# Patient Record
Sex: Male | Born: 1950 | Race: White | Hispanic: No | Marital: Married | Smoking: Former smoker
Health system: Southern US, Community
[De-identification: ages and names within clinical notes are randomized; demographics above are authoritative.]

## PROBLEM LIST (undated history)

## (undated) DIAGNOSIS — C649 Malignant neoplasm of unspecified kidney, except renal pelvis: Secondary | ICD-10-CM

## (undated) DIAGNOSIS — I10 Essential (primary) hypertension: Secondary | ICD-10-CM

## (undated) DIAGNOSIS — I4891 Unspecified atrial fibrillation: Secondary | ICD-10-CM

## (undated) DIAGNOSIS — J439 Emphysema, unspecified: Secondary | ICD-10-CM

## (undated) HISTORY — PX: APPENDECTOMY: SHX54

## (undated) HISTORY — PX: ROTATOR CUFF REPAIR: SHX139

## (undated) HISTORY — PX: TONSILLECTOMY: SUR1361

---

## 2004-09-23 ENCOUNTER — Emergency Department: Payer: Self-pay | Admitting: General Practice

## 2005-03-01 ENCOUNTER — Emergency Department: Payer: Self-pay | Admitting: Emergency Medicine

## 2008-06-04 ENCOUNTER — Ambulatory Visit: Payer: Self-pay | Admitting: Unknown Physician Specialty

## 2008-06-15 ENCOUNTER — Ambulatory Visit: Payer: Self-pay | Admitting: Unknown Physician Specialty

## 2008-10-19 ENCOUNTER — Emergency Department: Payer: Self-pay | Admitting: Emergency Medicine

## 2008-10-29 ENCOUNTER — Emergency Department: Payer: Self-pay | Admitting: Emergency Medicine

## 2011-09-11 ENCOUNTER — Emergency Department: Payer: Self-pay | Admitting: Emergency Medicine

## 2011-09-11 LAB — URINALYSIS, COMPLETE
Bacteria: NONE SEEN
Bilirubin,UR: NEGATIVE
Blood: NEGATIVE
Hyaline Cast: 1
Nitrite: NEGATIVE
Protein: NEGATIVE
Specific Gravity: 1.018 (ref 1.003–1.030)
WBC UR: <1 /HPF (ref 0–5)

## 2013-10-23 ENCOUNTER — Emergency Department: Payer: Self-pay | Admitting: Emergency Medicine

## 2013-10-23 LAB — BASIC METABOLIC PANEL
Anion Gap: 11 (ref 7–16)
BUN: 36 mg/dL — AB (ref 7–18)
CHLORIDE: 107 mmol/L (ref 98–107)
CO2: 25 mmol/L (ref 21–32)
CREATININE: 1.79 mg/dL — AB (ref 0.60–1.30)
Calcium, Total: 8.6 mg/dL (ref 8.5–10.1)
EGFR (Non-African Amer.): 40 — ABNORMAL LOW
GFR CALC AF AMER: 47 — AB
Glucose: 114 mg/dL — ABNORMAL HIGH (ref 65–99)
Osmolality: 294 (ref 275–301)
POTASSIUM: 3.4 mmol/L — AB (ref 3.5–5.1)
Sodium: 143 mmol/L (ref 136–145)

## 2013-10-23 LAB — CBC
HCT: 43.4 % (ref 40.0–52.0)
HGB: 14.2 g/dL (ref 13.0–18.0)
MCH: 30.7 pg (ref 26.0–34.0)
MCHC: 32.7 g/dL (ref 32.0–36.0)
MCV: 94 fL (ref 80–100)
Platelet: 237 10*3/uL (ref 150–440)
RBC: 4.62 10*6/uL (ref 4.40–5.90)
RDW: 13.3 % (ref 11.5–14.5)
WBC: 8.9 10*3/uL (ref 3.8–10.6)

## 2013-10-23 LAB — TROPONIN I: Troponin-I: <0.02 ng/mL

## 2015-03-08 ENCOUNTER — Emergency Department
Admission: EM | Admit: 2015-03-08 | Discharge: 2015-03-08 | Disposition: A | Discharge status: Home / Self Care | Payer: Non-veteran care | Attending: Student | Admitting: Student

## 2015-03-08 ENCOUNTER — Emergency Department: Payer: Non-veteran care

## 2015-03-08 ENCOUNTER — Encounter: Payer: Self-pay | Admitting: Emergency Medicine

## 2015-03-08 DIAGNOSIS — R1084 Generalized abdominal pain: Secondary | ICD-10-CM | POA: Insufficient documentation

## 2015-03-08 DIAGNOSIS — Y9389 Activity, other specified: Secondary | ICD-10-CM | POA: Insufficient documentation

## 2015-03-08 DIAGNOSIS — R55 Syncope and collapse: Secondary | ICD-10-CM | POA: Diagnosis present

## 2015-03-08 DIAGNOSIS — W01198A Fall on same level from slipping, tripping and stumbling with subsequent striking against other object, initial encounter: Secondary | ICD-10-CM | POA: Diagnosis not present

## 2015-03-08 DIAGNOSIS — Y998 Other external cause status: Secondary | ICD-10-CM | POA: Diagnosis not present

## 2015-03-08 DIAGNOSIS — R197 Diarrhea, unspecified: Secondary | ICD-10-CM | POA: Diagnosis not present

## 2015-03-08 DIAGNOSIS — Z87891 Personal history of nicotine dependence: Secondary | ICD-10-CM | POA: Diagnosis not present

## 2015-03-08 DIAGNOSIS — R110 Nausea: Secondary | ICD-10-CM | POA: Insufficient documentation

## 2015-03-08 DIAGNOSIS — Y9289 Other specified places as the place of occurrence of the external cause: Secondary | ICD-10-CM | POA: Diagnosis not present

## 2015-03-08 DIAGNOSIS — Z043 Encounter for examination and observation following other accident: Secondary | ICD-10-CM | POA: Insufficient documentation

## 2015-03-08 HISTORY — DX: Essential (primary) hypertension: I10

## 2015-03-08 HISTORY — DX: Malignant neoplasm of unspecified kidney, except renal pelvis: C64.9

## 2015-03-08 HISTORY — DX: Emphysema, unspecified: J43.9

## 2015-03-08 HISTORY — DX: Unspecified atrial fibrillation: I48.91

## 2015-03-08 LAB — BASIC METABOLIC PANEL
ANION GAP: 5 (ref 5–15)
BUN: 26 mg/dL — AB (ref 6–20)
CHLORIDE: 106 mmol/L (ref 101–111)
CO2: 27 mmol/L (ref 22–32)
Calcium: 9.2 mg/dL (ref 8.9–10.3)
Creatinine, Ser: 1.59 mg/dL — ABNORMAL HIGH (ref 0.61–1.24)
GFR calc Af Amer: 52 mL/min — ABNORMAL LOW (ref >60)
GFR calc non Af Amer: 45 mL/min — ABNORMAL LOW (ref >60)
GLUCOSE: 140 mg/dL — AB (ref 65–99)
POTASSIUM: 4.6 mmol/L (ref 3.5–5.1)
Sodium: 138 mmol/L (ref 135–145)

## 2015-03-08 LAB — URINALYSIS COMPLETE WITH MICROSCOPIC (ARMC ONLY)
BACTERIA UA: NONE SEEN
Bilirubin Urine: NEGATIVE
Glucose, UA: NEGATIVE mg/dL
Hgb urine dipstick: NEGATIVE
LEUKOCYTES UA: NEGATIVE
Nitrite: NEGATIVE
PH: 5 (ref 5.0–8.0)
PROTEIN: NEGATIVE mg/dL
SPECIFIC GRAVITY, URINE: 1.017 (ref 1.005–1.030)
SQUAMOUS EPITHELIAL / LPF: NONE SEEN

## 2015-03-08 LAB — CBC
HEMATOCRIT: 45.1 % (ref 40.0–52.0)
HEMOGLOBIN: 14.9 g/dL (ref 13.0–18.0)
MCH: 30.8 pg (ref 26.0–34.0)
MCHC: 33.1 g/dL (ref 32.0–36.0)
MCV: 93.1 fL (ref 80.0–100.0)
Platelets: 233 10*3/uL (ref 150–440)
RBC: 4.84 MIL/uL (ref 4.40–5.90)
RDW: 13.3 % (ref 11.5–14.5)
WBC: 11.1 10*3/uL — ABNORMAL HIGH (ref 3.8–10.6)

## 2015-03-08 LAB — HEPATIC FUNCTION PANEL
ALBUMIN: 4.2 g/dL (ref 3.5–5.0)
ALK PHOS: 55 U/L (ref 38–126)
ALT: 28 U/L (ref 17–63)
AST: 29 U/L (ref 15–41)
BILIRUBIN DIRECT: 0.2 mg/dL (ref 0.1–0.5)
BILIRUBIN INDIRECT: 0.6 mg/dL (ref 0.3–0.9)
BILIRUBIN TOTAL: 0.8 mg/dL (ref 0.3–1.2)
Total Protein: 7 g/dL (ref 6.5–8.1)

## 2015-03-08 LAB — TROPONIN I: Troponin I: <0.03 ng/mL (ref <0.031)

## 2015-03-08 LAB — LIPASE, BLOOD: LIPASE: 26 U/L (ref 11–51)

## 2015-03-08 MED ORDER — ONDANSETRON 4 MG PO TBDP: 4.0000 mg | ORAL_TABLET | Freq: Three times a day (TID) | ORAL | Status: AC | PRN

## 2015-03-08 MED ORDER — SODIUM CHLORIDE 0.9 % IV BOLUS (SEPSIS)
1000.0000 mL | Freq: Once | INTRAVENOUS | Status: AC
  Administered 2015-03-08: 1000 mL via INTRAVENOUS

## 2015-03-08 NOTE — ED Notes (Signed)
Patient explains that he had just gotten off the toilet 20 seconds before his syncopal episode.  Patient states he had severe abdominal pain with sudden onset of diarrhea, N/V.  Shortly after patient stood from the toilet he passed out in his hallway.

## 2015-03-08 NOTE — ED Provider Notes (Signed)
[facility] Emergency Department Provider Note  ____________________________________________  Time seen: Approximately 12:10 PM  I have reviewed the triage vital signs and the nursing notes.   HISTORY  Chief Complaint Loss of Consciousness and Abdominal Pain    HPI Duane Klein is a 64 y.o. male with past medical history of COPD, GERD, hyperlipidemia, coronary artery disease, stage III chronic kidney disease who presents for evaluation of syncopal episode which occurred suddenly just prior to arrival and is now resolved, was severe at its maximum. The patient reports that he went to bed last night in his usual state of health however awoke this morning and has had several episodes of large volume nonbloody diarrhea. He reports that he was sitting on the toilet today after having a bowel movement which was diarrhea, he complained to his wife that he felt "like I'm going to pass out". She encouraged him not to do it on the toilet. He stood up and began walking towards the bed, began increasingly lightheaded and fainted, falling and hitting his head. Afterwards, he developed severe abdominal "churning" and nausea and his wife noted that he was diaphoretic. He denies any chest pain or difficulty breathing. No fevers or chills. He reports that he began to feel lightheaded when he sits or stands up but there are no other modifying factors.  Contrary to the triagenote, he has no diagnosis of atrial fibrillation or kidney cancer.    Past medical history as above.   There are no active problems to display for this patient.   Past Surgical History  Procedure Laterality Date  . Appendectomy    . Tonsillectomy    . Rotator cuff repair      right    No current outpatient prescriptions on file.  Allergies Gabapentin and Tramadol  No family history on file.  Social History Social History  Substance Use Topics  . Smoking status: Former Research scientist (life sciences)  . Smokeless  tobacco: None  . Alcohol Use: No    Review of Systems Constitutional: No fever/chills Eyes: No visual changes. ENT: No sore throat. Cardiovascular: Denies chest pain. Respiratory: Denies shortness of breath. Gastrointestinal: + abdominal pain.  + nausea, no vomiting.  + diarrhea.  No constipation. Genitourinary: Negative for dysuria. Musculoskeletal: Negative for back pain. Skin: Negative for rash. Neurological: Negative for headaches, focal weakness or numbness.  10-point ROS otherwise negative.  ____________________________________________   PHYSICAL EXAM:  VITAL SIGNS: ED Triage Vitals  Enc Vitals Group     BP 03/08/15 1201 126/76 mmHg     Pulse Rate 03/08/15 1201 85     Resp 03/08/15 1201 15     Temp 03/08/15 1201 97.9 F (36.6 C)     Temp Source 03/08/15 1201 Oral     SpO2 03/08/15 1201 94 %     Weight 03/08/15 1201 198 lb (89.812 kg)     Height 03/08/15 1201 6\' 2"  (1.88 m)     Head Cir --      Peak Flow --      Pain Score 03/08/15 1203 8     Pain Loc --      Pain Edu? --      Excl. in [place]? --     Constitutional: Alert and oriented. Nontoxic appearing and in no acute distress. Eyes: Conjunctivae are normal. PERRL. EOMI. Head: Atraumatic. Nose: No congestion/rhinnorhea. Mouth/Throat: Mucous membranes are slightly dry.  Oropharynx non-erythematous. Neck: No stridor.  Cardiovascular: Normal rate, regular rhythm. Grossly normal heart sounds.  Good peripheral circulation. Respiratory: Normal respiratory effort.  No retractions. Lungs CTAB. Gastrointestinal: Soft and nontender. Bowel sounds No distention.  No CVA tenderness. Genitourinary: deferred Musculoskeletal: No lower extremity tenderness nor edema.  No joint effusions. Neurologic:  Normal speech and language. No gross focal neurologic deficits are appreciated. 5 out of 5 strength in bilateral upper and lower extremities, sensation intact to light touch throughout. Skin:  Skin is warm, dry and intact. No  rash noted. Psychiatric: Mood and affect are normal. Speech and behavior are normal.  ____________________________________________   LABS (all labs ordered are listed, but only abnormal results are displayed)  Labs Reviewed  BASIC METABOLIC PANEL - Abnormal; Notable for the following:    Glucose, Bld 140 (*)    BUN 26 (*)    Creatinine, Ser 1.59 (*)    GFR calc non Af Amer 45 (*)    GFR calc Af Amer 52 (*)    All other components within normal limits  CBC - Abnormal; Notable for the following:    WBC 11.1 (*)    All other components within normal limits  LIPASE, BLOOD  TROPONIN I  URINALYSIS COMPLETEWITH MICROSCOPIC ([facility] ONLY)  HEPATIC FUNCTION PANEL   ____________________________________________  EKG  ED ECG REPORT I, Joanne Gavel, the attending physician, personally viewed and interpreted this ECG.   Date: 03/08/2015  EKG Time: 11:59  Rate: 83  Rhythm: normal EKG, normal sinus rhythm  Axis: normal  Intervals:none  ST&T Change: No acute ST elevation.  ____________________________________________  RADIOLOGY  CXR IMPRESSION: Stable scarring right upper lobe. No edema or consolidation.  CT head  IMPRESSION: Chronic ischemic changes without acute abnormality.  ____________________________________________   PROCEDURES  Procedure(s) performed: None  Critical Care performed: No  ____________________________________________   INITIAL IMPRESSION / ASSESSMENT AND PLAN / ED COURSE  Pertinent labs & imaging results that were available during my care of the patient were reviewed by me and considered in my medical decision making (see chart for details).  Duane Klein is a 64 y.o. male with past medical history of COPD, GERD, hyperlipidemia, coronary artery disease, stage III chronic kidney disease who presents for evaluation of syncopal episode which occurred suddenly just prior to arrival. On exam, he is nontoxic appearing and in no acute distress.  Vital signs stable, he is afebrile. He has a benign abdominal examination. He does have positive orthostatic vital signs with significant decrease in his blood pressure when he attempts to sit up, he is unable to stand because she feels too lightheaded. Suspect vasovagal syncope in the setting of orthostatic hypotension. Suspect is likely dehydrated in the setting of a viral illness which has caused severe diarrhea today. He has had complete resolution of his abdominal pain has no abdominal tenderness. Doubt purely cardiogenic syncope given no palpitations, no chest pain, reassuring EKG. Doubt purely neurogenic cause of his syncope given intact neuro exam however we'll obtain screening labs, chest x-ray, CT head, will give liberal IV fluids and reassess for disposition.  ----------------------------------------- 5:09 PM on 03/08/2015 -----------------------------------------  The patient reports he feels much better after fluids. He is up, ambulatory without any lightheadedness. He is tolerating by mouth intake and desires discharge. Labs reviewed and are notable for very mild leukocytosis, BMP with creatinine mildly elevated at 1.59 likely secondary to dehydration. Troponin negative. Urinalysis is not consistent with infection. CT head and chest x-ray negative for any acute pathology. Discussed return precautions, need for close PCP follow-up as well as need for adequate  oral hydration. He and his wife at bedside are comfortable with the discharge plan. ____________________________________________   FINAL CLINICAL IMPRESSION(S) / ED DIAGNOSES  Final diagnoses:  Syncope, unspecified syncope type  Diarrhea, unspecified type  Generalized abdominal pain      Joanne Gavel, MD 03/08/15 1712

## 2015-03-08 NOTE — ED Notes (Signed)
Pt comes into the ED via EMS from home c/o lower abdominal pain that caused N/V.  Patient then had sudden onset of syncopal episode where he hit had LOC and hit his head.  Patient currently denies any N/V or lower abd pain but does state he has a headache.  Patient has h/o HTN, emphysema, A-fib, and recent diagnoses of stage 3 kidney cancer. EKG unremarkable, 110/70, 80-90's HR, 134 CBG.

## 2015-03-08 NOTE — Discharge Instructions (Signed)

## 2015-10-23 ENCOUNTER — Other Ambulatory Visit: Payer: Self-pay | Admitting: Physician Assistant

## 2015-10-23 DIAGNOSIS — R51 Headache: Principal | ICD-10-CM

## 2015-10-23 DIAGNOSIS — R519 Headache, unspecified: Secondary | ICD-10-CM

## 2015-11-05 ENCOUNTER — Ambulatory Visit: Payer: No Typology Code available for payment source

## 2016-11-17 ENCOUNTER — Encounter: Payer: Non-veteran care | Attending: Student in an Organized Health Care Education/Training Program

## 2016-11-17 VITALS — Ht 73.0 in | Wt 198.9 lb

## 2016-11-17 DIAGNOSIS — Z85528 Personal history of other malignant neoplasm of kidney: Secondary | ICD-10-CM | POA: Diagnosis not present

## 2016-11-17 DIAGNOSIS — I4891 Unspecified atrial fibrillation: Secondary | ICD-10-CM | POA: Diagnosis not present

## 2016-11-17 DIAGNOSIS — Z87891 Personal history of nicotine dependence: Secondary | ICD-10-CM | POA: Diagnosis not present

## 2016-11-17 DIAGNOSIS — J449 Chronic obstructive pulmonary disease, unspecified: Secondary | ICD-10-CM

## 2016-11-17 DIAGNOSIS — I10 Essential (primary) hypertension: Secondary | ICD-10-CM | POA: Insufficient documentation

## 2016-11-17 NOTE — Progress Notes (Signed)
Pulmonary Individual Treatment Plan  Patient Details  Name: Duane Klein MRN: [id number] Date of Birth: Sep 19, 1950 Referring Provider:     Pulmonary Rehab from 11/17/2016 in [facility] Cardiac and Pulmonary Rehab  Referring Provider  [name]      Initial Encounter Date:    Pulmonary Rehab from 11/17/2016 in [facility] Cardiac and Pulmonary Rehab  Date  11/17/16  Referring Provider  [name]      Visit Diagnosis: Chronic obstructive pulmonary disease, unspecified COPD type ([place])  Patient's Home Medications on Admission:  Current Outpatient Prescriptions:  .  ondansetron (ZOFRAN ODT) 4 MG disintegrating tablet, Take 1 tablet (4 mg total) by mouth every 8 (eight) hours as needed for nausea or vomiting., Disp: 10 tablet, Rfl: 0  Past Medical History: Past Medical History:  Diagnosis Date  . Atrial fibrillation ([place])   . Cancer of kidney ([place])   . Emphysema lung ([place])   . Hypertension     Tobacco Use: History  Smoking Status  . Former Smoker  . Packs/day: 3.00  . Years: 35.00  . Types: Cigarettes  . Quit date: 04/09/2002  Smokeless Tobacco  . Former Systems developer  . Types: Snuff, Chew  . Quit date: 03/09/1997    Comment: patient has not smoked since 2004    Labs: Recent Review Flowsheet Data    There is no flowsheet data to display.       Pulmonary Assessment Scores:     Pulmonary Assessment Scores    Row Name 11/17/16 1128         ADL UCSD   ADL Phase Entry     SOB Score total 46     Rest 0     Walk 2     Stairs 3     Bath 1     Dress 4     Shop 1       CAT Score   CAT Score 26       mMRC Score   mMRC Score 0        Pulmonary Function Assessment:     Pulmonary Function Assessment - 11/17/16 1125      Initial Spirometry Results   FVC% 56 %   FEV1% 52 %   FEV1/FVC Ratio 69.55   Comments Good effort     Post Bronchodilator Spirometry Results   FVC% 48.16 %   FEV1% 34.32 %   FEV1/FVC Ratio 53.51   Comments Good effort. best of 2     Breath   Bilateral  Breath Sounds Clear   Shortness of Breath Limiting activity      Exercise Target Goals: Date: 11/17/16  Exercise Program Goal: Individual exercise prescription set with THRR, safety & activity barriers. Participant demonstrates ability to understand and report RPE using BORG scale, to self-measure pulse accurately, and to acknowledge the importance of the exercise prescription.  Exercise Prescription Goal: Starting with aerobic activity 30 plus minutes a day, 3 days per week for initial exercise prescription. Provide home exercise prescription and guidelines that participant acknowledges understanding prior to discharge.  Activity Barriers & Risk Stratification:   6 Minute Walk:     6 Minute Walk    Row Name 11/17/16 1230         6 Minute Walk   Distance 1500 feet     Walk Time 6 minutes     # of Rest Breaks 0     MPH 2.84     METS 3.82     RPE 13  Perceived Dyspnea  3     Symptoms No     Resting HR 83 bpm     Resting BP 118/62     Resting Oxygen Saturation  94 %     Exercise Oxygen Saturation  during 6 min walk 94 %     Max Ex. HR 105 bpm     Max Ex. BP 134/56       Interval HR   3 Minute HR 97     5 Minute HR 99     6 Minute HR 105     2 Minute Post HR 80     Interval Heart Rate? Yes       Interval Oxygen   Interval Oxygen? Yes     Baseline Oxygen Saturation % 94 %     1 Minute Liters of Oxygen 0 L     2 Minute Liters of Oxygen 0 L     3 Minute Oxygen Saturation % 94 %     3 Minute Liters of Oxygen 0 L     4 Minute Liters of Oxygen 0 L     5 Minute Oxygen Saturation % 95 %     5 Minute Liters of Oxygen 0 L     6 Minute Oxygen Saturation % 94 %     6 Minute Liters of Oxygen 0 L     2 Minute Post Oxygen Saturation % 96 %     2 Minute Post Liters of Oxygen 0 L       Oxygen Initial Assessment:     Oxygen Initial Assessment - 11/17/16 1118      Home Oxygen   Home Oxygen Device None   Sleep Oxygen Prescription None   Home Exercise Oxygen  Prescription None   Home at Rest Exercise Oxygen Prescription None     Initial 6 min Walk   Oxygen Used None     Program Oxygen Prescription   Program Oxygen Prescription None     Intervention   Short Term Goals To learn and understand importance of maintaining oxygen saturations>88%;To learn and demonstrate proper use of respiratory medications;To learn and demonstrate proper pursed lip breathing techniques or other breathing techniques.;To learn and understand importance of monitoring SPO2 with pulse oximeter and demonstrate accurate use of the pulse oximeter.   Long  Term Goals Maintenance of O2 saturations>88%;Compliance with respiratory medication;Demonstrates proper use of MDI's;Exhibits proper breathing techniques, such as pursed lip breathing or other method taught during program session;Verbalizes importance of monitoring SPO2 with pulse oximeter and return demonstration      Oxygen Re-Evaluation:   Oxygen Discharge (Final Oxygen Re-Evaluation):   Initial Exercise Prescription:     Initial Exercise Prescription - 11/17/16 1200      Date of Initial Exercise RX and Referring Provider   Date 11/17/16   Referring Provider [name]     Treadmill   MPH 2.8   Grade 1.5   Minutes 15   METs 3.72     Recumbant Bike   Level 5   RPM 60   Watts 50   Minutes 15   METs 3.75     REL-XR   Level 4   Speed 50   Minutes 15   METs 3.7     Prescription Details   Frequency (times per week) 3   Duration Progress to 45 minutes of aerobic exercise without signs/symptoms of physical distress     Intensity   THRR 40-80% of Max Heartrate 112-141  Ratings of Perceived Exertion 11-15   Perceived Dyspnea 0-4     Resistance Training   Training Prescription Yes   Weight 4   Reps 10-15      Perform Capillary Blood Glucose checks as needed.  Exercise Prescription Changes:   Exercise Comments:   Exercise Goals and Review:      Exercise Goals    Row Name 11/17/16 1319              Exercise Goals   Increase Physical Activity Yes       Intervention Provide advice, education, support and counseling about physical activity/exercise needs.;Develop an individualized exercise prescription for aerobic and resistive training based on initial evaluation findings, risk stratification, comorbidities and participant's personal goals.       Expected Outcomes Achievement of increased cardiorespiratory fitness and enhanced flexibility, muscular endurance and strength shown through measurements of functional capacity and personal statement of participant.       Increase Strength and Stamina Yes       Intervention Provide advice, education, support and counseling about physical activity/exercise needs.;Develop an individualized exercise prescription for aerobic and resistive training based on initial evaluation findings, risk stratification, comorbidities and participant's personal goals.       Expected Outcomes Achievement of increased cardiorespiratory fitness and enhanced flexibility, muscular endurance and strength shown through measurements of functional capacity and personal statement of participant.       Able to understand and use rate of perceived exertion (RPE) scale Yes       Intervention Provide education and explanation on how to use RPE scale       Expected Outcomes Long Term:  Able to use RPE to guide intensity level when exercising independently;Short Term: Able to use RPE daily in rehab to express subjective intensity level       Able to understand and use Dyspnea scale Yes       Intervention Provide education and explanation on how to use Dyspnea scale       Expected Outcomes Short Term: Able to use Dyspnea scale daily in rehab to express subjective sense of shortness of breath during exertion;Long Term: Able to use Dyspnea scale to guide intensity level when exercising independently       Knowledge and understanding of Target Heart Rate Range (THRR) Yes        Intervention Provide education and explanation of THRR including how the numbers were predicted and where they are located for reference       Expected Outcomes Short Term: Able to state/look up THRR;Long Term: Able to use THRR to govern intensity when exercising independently;Short Term: Able to use daily as guideline for intensity in rehab       Able to check pulse independently Yes       Intervention Provide education and demonstration on how to check pulse in carotid and radial arteries.;Review the importance of being able to check your own pulse for safety during independent exercise       Expected Outcomes Short Term: Able to explain why pulse checking is important during independent exercise;Long Term: Able to check pulse independently and accurately       Understanding of Exercise Prescription Yes       Intervention Provide education, explanation, and written materials on patient's individual exercise prescription       Expected Outcomes Short Term: Able to explain program exercise prescription;Long Term: Able to explain home exercise prescription to exercise independently  Exercise Goals Re-Evaluation :   Discharge Exercise Prescription (Final Exercise Prescription Changes):   Nutrition:  Target Goals: Understanding of nutrition guidelines, daily intake of sodium 1500mg , cholesterol 200mg , calories 30% from fat and 7% or less from saturated fats, daily to have 5 or more servings of fruits and vegetables.  Biometrics:     Pre Biometrics - 11/17/16 1226      Pre Biometrics   Height 6\' 1"  (1.854 m)   Weight 198 lb 14.4 oz (90.2 kg)   Waist Circumference 41 inches   Hip Circumference 42 inches   Waist to Hip Ratio 0.98 %   BMI (Calculated) 26.25       Nutrition Therapy Plan and Nutrition Goals:     Nutrition Therapy & Goals - 11/17/16 1117      Nutrition Therapy   RD appointment defered Yes     Personal Nutrition Goals   Comments His Doctor guides his  eating habits.     Intervention Plan   Intervention Nutrition handout(s) given to patient.;Prescribe, educate and counsel regarding individualized specific dietary modifications aiming towards targeted core components such as weight, hypertension, lipid management, diabetes, heart failure and other comorbidities.   Expected Outcomes Short Term Goal: Understand basic principles of dietary content, such as calories, fat, sodium, cholesterol and nutrients.;Short Term Goal: A plan has been developed with personal nutrition goals set during dietitian appointment.      Nutrition Discharge: Rate Your Plate Scores:   Nutrition Goals Re-Evaluation:   Nutrition Goals Discharge (Final Nutrition Goals Re-Evaluation):   Psychosocial: Target Goals: Acknowledge presence or absence of significant depression and/or stress, maximize coping skills, provide positive support system. Participant is able to verbalize types and ability to use techniques and skills needed for reducing stress and depression.   Initial Review & Psychosocial Screening:     Initial Psych Review & Screening - 11/17/16 1116      Initial Review   Current issues with History of Depression     Family Dynamics   Good Support System? Yes     Barriers   Psychosocial barriers to participate in program There are no identifiable barriers or psychosocial needs.     Screening Interventions   Interventions Encouraged to exercise;Yes   Expected Outcomes Short Term goal: Utilizing psychosocial counselor, staff and physician to assist with identification of specific Stressors or current issues interfering with healing process. Setting desired goal for each stressor or current issue identified.;Short Term goal: Identification and review with participant of any Quality of Life or Depression concerns found by scoring the questionnaire.;Long Term goal: The participant improves quality of Life and PHQ9 Scores as seen by post scores and/or  verbalization of changes;Long Term Goal: Stressors or current issues are controlled or eliminated.      Quality of Life Scores:   PHQ-9: Recent Review Flowsheet Data    Depression screen [facility] 2/9 11/17/2016   Decreased Interest 0   Down, Depressed, Hopeless 0   PHQ - 2 Score 0   Altered sleeping 0   Tired, decreased energy 1   Change in appetite 0   Feeling bad or failure about yourself  0   Trouble concentrating 0   Moving slowly or fidgety/restless 0   Suicidal thoughts 0   PHQ-9 Score 1   Difficult doing work/chores Somewhat difficult     Interpretation of Total Score  Total Score Depression Severity:  1-4 = Minimal depression, 5-9 = Mild depression, 10-14 = Moderate depression, 15-19 = Moderately severe depression,  20-27 = Severe depression   Psychosocial Evaluation and Intervention:   Psychosocial Re-Evaluation:   Psychosocial Discharge (Final Psychosocial Re-Evaluation):   Education: Education Goals: Education classes will be provided on a weekly basis, covering required topics. Participant will state understanding/return demonstration of topics presented.  Learning Barriers/Preferences:     Learning Barriers/Preferences - 11/17/16 1129      Learning Barriers/Preferences   Learning Barriers Sight   Learning Preferences None      Education Topics: Initial Evaluation Education: - Verbal, written and demonstration of respiratory meds, RPE/PD scales, oximetry and breathing techniques. Instruction on use of nebulizers and MDIs: cleaning and proper use, rinsing mouth with steroid doses and importance of monitoring MDI activations.   Pulmonary Rehab from 11/17/2016 in [facility] Cardiac and Pulmonary Rehab  Date  11/17/16  Educator  [facility]  Instruction Review Code  1- Verbalizes Understanding      General Nutrition Guidelines/Fats and Fiber: -Group instruction provided by verbal, written material, models and posters to present the general guidelines for heart healthy  nutrition. Gives an explanation and review of dietary fats and fiber.   Controlling Sodium/Reading Food Labels: -Group verbal and written material supporting the discussion of sodium use in heart healthy nutrition. Review and explanation with models, verbal and written materials for utilization of the food label.   Exercise Physiology & Risk Factors: - Group verbal and written instruction with models to review the exercise physiology of the cardiovascular system and associated critical values. Details cardiovascular disease risk factors and the goals associated with each risk factor.   Aerobic Exercise & Resistance Training: - Gives group verbal and written discussion on the health impact of inactivity. On the components of aerobic and resistive training programs and the benefits of this training and how to safely progress through these programs.   Flexibility, Balance, General Exercise Guidelines: - Provides group verbal and written instruction on the benefits of flexibility and balance training programs. Provides general exercise guidelines with specific guidelines to those with heart or lung disease. Demonstration and skill practice provided.   Stress Management: - Provides group verbal and written instruction about the health risks of elevated stress, cause of high stress, and healthy ways to reduce stress.   Depression: - Provides group verbal and written instruction on the correlation between heart/lung disease and depressed mood, treatment options, and the stigmas associated with seeking treatment.   Exercise & Equipment Safety: - Individual verbal instruction and demonstration of equipment use and safety with use of the equipment.   Infection Prevention: - Provides verbal and written material to individual with discussion of infection control including proper hand washing and proper equipment cleaning during exercise session.   Pulmonary Rehab from 11/17/2016 in [facility] Cardiac  and Pulmonary Rehab  Date  11/17/16  Educator  [facility]  Instruction Review Code  1- Verbalizes Understanding      Falls Prevention: - Provides verbal and written material to individual with discussion of falls prevention and safety.   Pulmonary Rehab from 11/17/2016 in [facility] Cardiac and Pulmonary Rehab  Date  11/17/16  Educator  [facility]  Instruction Review Code  1- Verbalizes Understanding      Diabetes: - Individual verbal and written instruction to review signs/symptoms of diabetes, desired ranges of glucose level fasting, after meals and with exercise. Advice that pre and post exercise glucose checks will be done for 3 sessions at entry of program.   Chronic Lung Diseases: - Group verbal and written instruction to review new updates, new respiratory medications,  new advancements in procedures and treatments. Provide informative websites and "800" numbers of self-education.   Lung Procedures: - Group verbal and written instruction to describe testing methods done to diagnose lung disease. Review the outcome of test results. Describe the treatment choices: Pulmonary Function Tests, ABGs and oximetry.   Energy Conservation: - Provide group verbal and written instruction for methods to conserve energy, plan and organize activities. Instruct on pacing techniques, use of adaptive equipment and posture/positioning to relieve shortness of breath.   Triggers: - Group verbal and written instruction to review types of environmental controls: home humidity, furnaces, filters, dust mite/pet prevention, HEPA vacuums. To discuss weather changes, air quality and the benefits of nasal washing.   Exacerbations: - Group verbal and written instruction to provide: warning signs, infection symptoms, calling MD promptly, preventive modes, and value of vaccinations. Review: effective airway clearance, coughing and/or vibration techniques. Create an Sports administrator.   Oxygen: - Individual and group verbal and  written instruction on oxygen therapy. Includes supplement oxygen, available portable oxygen systems, continuous and intermittent flow rates, oxygen safety, concentrators, and Medicare reimbursement for oxygen.   Pulmonary Rehab from 11/17/2016 in [facility] Cardiac and Pulmonary Rehab  Date  11/17/16  Educator  [facility]  Instruction Review Code  1- Verbalizes Understanding      Respiratory Medications: - Group verbal and written instruction to review medications for lung disease. Drug class, frequency, complications, importance of spacers, rinsing mouth after steroid MDI's, and proper cleaning methods for nebulizers.   Pulmonary Rehab from 11/17/2016 in [facility] Cardiac and Pulmonary Rehab  Date  11/17/16  Educator  [facility]  Instruction Review Code  1- Verbalizes Understanding      AED/CPR: - Group verbal and written instruction with the use of models to demonstrate the basic use of the AED with the basic ABC's of resuscitation.   Breathing Retraining: - Provides individuals verbal and written instruction on purpose, frequency, and proper technique of diaphragmatic breathing and pursed-lipped breathing. Applies individual practice skills.   Pulmonary Rehab from 11/17/2016 in [facility] Cardiac and Pulmonary Rehab  Date  11/17/16  Educator  [facility]  Instruction Review Code  1- Verbalizes Understanding      Anatomy and Physiology of the Lungs: - Group verbal and written instruction with the use of models to provide basic lung anatomy and physiology related to function, structure and complications of lung disease.   Anatomy & Physiology of the Heart: - Group verbal and written instruction and models provide basic cardiac anatomy and physiology, with the coronary electrical and arterial systems. Review of: AMI, Angina, Valve disease, Heart Failure, Cardiac Arrhythmia, Pacemakers, and the ICD.   Heart Failure: - Group verbal and written instruction on the basics of heart failure: signs/symptoms, treatments,  explanation of ejection fraction, enlarged heart and cardiomyopathy.   Sleep Apnea: - Individual verbal and written instruction to review Obstructive Sleep Apnea. Review of risk factors, methods for diagnosing and types of masks and machines for OSA.   Anxiety: - Provides group, verbal and written instruction on the correlation between heart/lung disease and anxiety, treatment options, and management of anxiety.   Relaxation: - Provides group, verbal and written instruction about the benefits of relaxation for patients with heart/lung disease. Also provides patients with examples of relaxation techniques.   Cardiac Medications: - Group verbal and written instruction to review commonly prescribed medications for heart disease. Reviews the medication, class of the drug, and side effects.   Know Your Numbers: -Group verbal and written instruction about important  numbers in your health.  Review of Cholesterol, Blood Pressure, Diabetes, and BMI and the role they play in your overall health.   Other: -Provides group and verbal instruction on various topics (see comments)    Knowledge Questionnaire Score:     Knowledge Questionnaire Score - 11/17/16 1129      Knowledge Questionnaire Score   Pre Score 8/10  Reviewed with patient       Core Components/Risk Factors/Patient Goals at Admission:     Personal Goals and Risk Factors at Admission - 11/17/16 1130      Core Components/Risk Factors/Patient Goals on Admission    Weight Management Yes   Intervention Weight Management: Develop a combined nutrition and exercise program designed to reach desired caloric intake, while maintaining appropriate intake of nutrient and fiber, sodium and fats, and appropriate energy expenditure required for the weight goal.;Weight Management: Provide education and appropriate resources to help participant work on and attain dietary goals.;Weight Management/Obesity: Establish reasonable short term and  long term weight goals.   Expected Outcomes Short Term: Continue to assess and modify interventions until short term weight is achieved;Weight Loss: Understanding of general recommendations for a balanced deficit meal plan, which promotes 1-2 lb weight loss per week and includes a negative energy balance of [phone] kcal/d;Understanding recommendations for meals to include 15-35% energy as protein, 25-35% energy from fat, 35-60% energy from carbohydrates, less than 200mg  of dietary cholesterol, 20-35 gm of total fiber daily;Understanding of distribution of calorie intake throughout the day with the consumption of 4-5 meals/snacks   Improve shortness of breath with ADL's Yes   Intervention Provide education, individualized exercise plan and daily activity instruction to help decrease symptoms of SOB with activities of daily living.   Expected Outcomes Short Term: Achieves a reduction of symptoms when performing activities of daily living.   Hypertension Yes   Intervention Provide education on lifestyle modifcations including regular physical activity/exercise, weight management, moderate sodium restriction and increased consumption of fresh fruit, vegetables, and low fat dairy, alcohol moderation, and smoking cessation.;Monitor prescription use compliance.   Expected Outcomes Short Term: Continued assessment and intervention until BP is < 140/61mm HG in hypertensive participants. < 130/59mm HG in hypertensive participants with diabetes, heart failure or chronic kidney disease.;Long Term: Maintenance of blood pressure at goal levels.      Core Components/Risk Factors/Patient Goals Review:    Core Components/Risk Factors/Patient Goals at Discharge (Final Review):    ITP Comments:     ITP Comments    Row Name 11/17/16 1213           ITP Comments Medical Evaluation Completed. Chart sent to Dr. Emily Filbert director of [place] for signature and review.          Comments: Initial ITP

## 2016-11-17 NOTE — Patient Instructions (Addendum)
Patient Instructions  Patient Details  Name: Duane Klein MRN: [id number] Date of Birth: 1950-03-12 Referring Provider:  [place] are the personal goals you chose as well as exercise and nutrition goals. Our goal is to help you keep on track towards obtaining and maintaining your goals. We will be discussing your progress on these goals with you throughout the program.  Initial Exercise Prescription:     Initial Exercise Prescription - 11/17/16 1200      Date of Initial Exercise RX and Referring Provider   Date 11/17/16   Referring Provider [name]     Treadmill   MPH 2.8   Grade 1.5   Minutes 15   METs 3.72     Recumbant Bike   Level 5   RPM 60   Watts 50   Minutes 15   METs 3.75     REL-XR   Level 4   Speed 50   Minutes 15   METs 3.7     Prescription Details   Frequency (times per week) 3   Duration Progress to 45 minutes of aerobic exercise without signs/symptoms of physical distress     Intensity   THRR 40-80% of Max Heartrate 112-141   Ratings of Perceived Exertion 11-15   Perceived Dyspnea 0-4     Resistance Training   Training Prescription Yes   Weight 4   Reps 10-15      Exercise Goals: Frequency: Be able to perform aerobic exercise three times per week working toward 3-5 days per week.  Intensity: Work with a perceived exertion of 11 (fairly light) - 15 (hard) as tolerated. Follow your new exercise prescription and watch for changes in prescription as you progress with the program. Changes will be reviewed with you when they are made.  Duration: You should be able to do 30 minutes of continuous aerobic exercise in addition to a 5 minute warm-up and a 5 minute cool-down routine.  Nutrition Goals: Your personal nutrition goals will be established when you do your nutrition analysis with the dietician.  The following are nutrition guidelines to follow: Cholesterol < 200mg /day Sodium < 1500mg /day Fiber: Men over 50 yrs - 30  grams per day  Personal Goals:     Personal Goals and Risk Factors at Admission - 11/17/16 1130      Core Components/Risk Factors/Patient Goals on Admission    Weight Management Yes   Intervention Weight Management: Develop a combined nutrition and exercise program designed to reach desired caloric intake, while maintaining appropriate intake of nutrient and fiber, sodium and fats, and appropriate energy expenditure required for the weight goal.;Weight Management: Provide education and appropriate resources to help participant work on and attain dietary goals.;Weight Management/Obesity: Establish reasonable short term and long term weight goals.   Expected Outcomes Short Term: Continue to assess and modify interventions until short term weight is achieved;Weight Loss: Understanding of general recommendations for a balanced deficit meal plan, which promotes 1-2 lb weight loss per week and includes a negative energy balance of [phone] kcal/d;Understanding recommendations for meals to include 15-35% energy as protein, 25-35% energy from fat, 35-60% energy from carbohydrates, less than 200mg  of dietary cholesterol, 20-35 gm of total fiber daily;Understanding of distribution of calorie intake throughout the day with the consumption of 4-5 meals/snacks   Improve shortness of breath with ADL's Yes   Intervention Provide education, individualized exercise plan and daily activity instruction to help decrease symptoms of SOB with activities of daily living.  Expected Outcomes Short Term: Achieves a reduction of symptoms when performing activities of daily living.   Hypertension Yes   Intervention Provide education on lifestyle modifcations including regular physical activity/exercise, weight management, moderate sodium restriction and increased consumption of fresh fruit, vegetables, and low fat dairy, alcohol moderation, and smoking cessation.;Monitor prescription use compliance.   Expected Outcomes Short  Term: Continued assessment and intervention until BP is < 140/57mm HG in hypertensive participants. < 130/39mm HG in hypertensive participants with diabetes, heart failure or chronic kidney disease.;Long Term: Maintenance of blood pressure at goal levels.      Tobacco Use Initial Evaluation: History  Smoking Status  . Former Smoker  . Packs/day: 3.00  . Years: 35.00  . Types: Cigarettes  . Quit date: 04/09/2002  Smokeless Tobacco  . Former Systems developer  . Types: Snuff, Chew  . Quit date: 03/09/1997    Comment: patient has not smoked since 2004    Exercise Goals and Review:     Exercise Goals    Row Name 11/17/16 1319             Exercise Goals   Increase Physical Activity Yes       Intervention Provide advice, education, support and counseling about physical activity/exercise needs.;Develop an individualized exercise prescription for aerobic and resistive training based on initial evaluation findings, risk stratification, comorbidities and participant's personal goals.       Expected Outcomes Achievement of increased cardiorespiratory fitness and enhanced flexibility, muscular endurance and strength shown through measurements of functional capacity and personal statement of participant.       Increase Strength and Stamina Yes       Intervention Provide advice, education, support and counseling about physical activity/exercise needs.;Develop an individualized exercise prescription for aerobic and resistive training based on initial evaluation findings, risk stratification, comorbidities and participant's personal goals.       Expected Outcomes Achievement of increased cardiorespiratory fitness and enhanced flexibility, muscular endurance and strength shown through measurements of functional capacity and personal statement of participant.       Able to understand and use rate of perceived exertion (RPE) scale Yes       Intervention Provide education and explanation on how to use RPE scale        Expected Outcomes Long Term:  Able to use RPE to guide intensity level when exercising independently;Short Term: Able to use RPE daily in rehab to express subjective intensity level       Able to understand and use Dyspnea scale Yes       Intervention Provide education and explanation on how to use Dyspnea scale       Expected Outcomes Short Term: Able to use Dyspnea scale daily in rehab to express subjective sense of shortness of breath during exertion;Long Term: Able to use Dyspnea scale to guide intensity level when exercising independently       Knowledge and understanding of Target Heart Rate Range (THRR) Yes       Intervention Provide education and explanation of THRR including how the numbers were predicted and where they are located for reference       Expected Outcomes Short Term: Able to state/look up THRR;Long Term: Able to use THRR to govern intensity when exercising independently;Short Term: Able to use daily as guideline for intensity in rehab       Able to check pulse independently Yes       Intervention Provide education and demonstration on how to check pulse in carotid and  radial arteries.;Review the importance of being able to check your own pulse for safety during independent exercise       Expected Outcomes Short Term: Able to explain why pulse checking is important during independent exercise;Long Term: Able to check pulse independently and accurately       Understanding of Exercise Prescription Yes       Intervention Provide education, explanation, and written materials on patient's individual exercise prescription       Expected Outcomes Short Term: Able to explain program exercise prescription;Long Term: Able to explain home exercise prescription to exercise independently          Copy of goals given to participant.

## 2016-11-20 ENCOUNTER — Encounter: Payer: Non-veteran care | Admitting: *Deleted

## 2016-11-20 DIAGNOSIS — J449 Chronic obstructive pulmonary disease, unspecified: Secondary | ICD-10-CM | POA: Diagnosis not present

## 2016-11-20 NOTE — Progress Notes (Addendum)
Daily Session Note  Patient Details  Name: Duane Klein MRN: [id number] Date of Birth: Sep 11, 1950 Referring Provider:     Pulmonary Rehab from 11/17/2016 in [facility]  Referring Provider  [name]      Encounter Date: 11/20/2016  Check In:     Session Check In - 11/20/16 1012      Check-In   Location [facility]-Cardiac & Pulmonary Rehab   Staff Present Renita Papa, RN Vickki Hearing, BA, ACSM CEP, Exercise Physiologist;Jessica Luan [place], [place], ACSM RCEP, Exercise Physiologist   Supervising physician immediately available to respond to emergencies [facility] immediately available ER MD   Physician(s) Dr. Clearnce Hasten and [name]    Medication changes reported     No   Fall or balance concerns reported    No   Warm-up and Cool-down Performed as group-led instruction   Resistance Training Performed Yes   VAD Patient? No     Pain Assessment   Currently in Pain? No/denies         History  Smoking Status  . Former Smoker  . Packs/day: 3.00  . Years: 35.00  . Types: Cigarettes  . Quit date: 04/09/2002  Smokeless Tobacco  . Former Systems developer  . Types: Snuff, Chew  . Quit date: 03/09/1997    Comment: patient has not smoked since 2004    Goals Met:  Proper associated with RPD/PD & O2 Sat Independence with exercise equipment Using PLB without cueing & demonstrates good technique Exercise tolerated well Strength training completed today  Goals Unmet:  Not Applicable  Comments: First full day of exercise!  Patient was oriented to gym and equipment including functions, settings, policies, and procedures.  Patient's individual exercise prescription and treatment plan were reviewed.  All starting workloads were established based on the results of the 6 minute walk test done at initial orientation visit.  The plan for exercise progression was also introduced and progression will be customized based on patient's performance and goals.     Dr. Emily Filbert is Medical  Director for [place] and [facility].

## 2016-11-23 DIAGNOSIS — J449 Chronic obstructive pulmonary disease, unspecified: Secondary | ICD-10-CM

## 2016-11-23 NOTE — Progress Notes (Signed)
Daily Session Note  Patient Details  Name: Duane Klein MRN: [id number] Date of Birth: 13-Apr-1950 Referring Provider:     Pulmonary Rehab from 11/17/2016 in [facility]  Referring Provider  [name]      Encounter Date: 11/23/2016  Check In:     Session Check In - 11/23/16 1006      Check-In   Location [facility] & Pul[facility] Re[facility]   Staff Present [name], MA, ACSM RCEP, Exercise Physiologist;[name], BA, ACSM CEP, Exercise Physiologist;[name], BS, ACSM CEP, Exercise Physiologist;[name] [name]   Supervising physician immediately available to respond to emergencies [facility] immediately available ER MD   Physician(s) Dr. [name] and [name]   Medication changes reported     No   Fall or balance concerns reported    No   Warm-up and Cool-down Performed as group-led instruction   Resistance Training Performed Yes   VAD Patient? No     Pain Assessment   Currently in Pain? No/denies   Multiple Pain Sites No         History  Smoking Status  . Former Smoker  . Packs/day: 3.00  . Years: 35.00  . Types: Cigarettes  . Quit date: 04/09/2002  Smokeless Tobacco  . Former Systems developer  . Types: Snuff, Chew  . Quit date: 03/09/1997    Comment: patient has not smoked since 2004    Goals Met:  Exercise tolerated well No report of cardiac concerns or symptoms Strength training completed today  Goals Unmet:  Not Applicable  Comments: Pt able to follow exercise prescription today without complaint.  Will continue to monitor for progression.   Dr. Emily Filbert is Medical Director for [place] and [facility].

## 2016-11-30 DIAGNOSIS — J449 Chronic obstructive pulmonary disease, unspecified: Secondary | ICD-10-CM

## 2016-11-30 NOTE — Progress Notes (Signed)
Pulmonary Individual Treatment Plan  Patient Details  Name: Duane Klein MRN: [id number] Date of Birth: 10/05/1950 Referring Provider:     Pulmonary Rehab from 11/17/2016 in [facility] Cardiac and Pulmonary Rehab  Referring Provider  [name]      Initial Encounter Date:    Pulmonary Rehab from 11/17/2016 in [facility] Cardiac and Pulmonary Rehab  Date  11/17/16  Referring Provider  [name]      Visit Diagnosis: Chronic obstructive pulmonary disease, unspecified COPD type ([place])  Patient's Home Medications on Admission:  Current Outpatient Prescriptions:  .  ondansetron (ZOFRAN ODT) 4 MG disintegrating tablet, Take 1 tablet (4 mg total) by mouth every 8 (eight) hours as needed for nausea or vomiting., Disp: 10 tablet, Rfl: 0  Past Medical History: Past Medical History:  Diagnosis Date  . Atrial fibrillation ([place])   . Cancer of kidney ([place])   . Emphysema lung ([place])   . Hypertension     Tobacco Use: History  Smoking Status  . Former Smoker  . Packs/day: 3.00  . Years: 35.00  . Types: Cigarettes  . Quit date: 04/09/2002  Smokeless Tobacco  . Former Systems developer  . Types: Snuff, Chew  . Quit date: 03/09/1997    Comment: patient has not smoked since 2004    Labs: Recent Review Flowsheet Data    There is no flowsheet data to display.       Pulmonary Assessment Scores:     Pulmonary Assessment Scores    Row Name 11/17/16 1128         ADL UCSD   ADL Phase Entry     SOB Score total 46     Rest 0     Walk 2     Stairs 3     Bath 1     Dress 4     Shop 1       CAT Score   CAT Score 26       mMRC Score   mMRC Score 0        Pulmonary Function Assessment:     Pulmonary Function Assessment - 11/17/16 1125      Initial Spirometry Results   FVC% 56 %   FEV1% 52 %   FEV1/FVC Ratio 69.55   Comments Good effort     Post Bronchodilator Spirometry Results   FVC% 48.16 %   FEV1% 34.32 %   FEV1/FVC Ratio 53.51   Comments Good effort. best of 2     Breath   Bilateral  Breath Sounds Clear   Shortness of Breath Limiting activity      Exercise Target Goals:    Exercise Program Goal: Individual exercise prescription set with THRR, safety & activity barriers. Participant demonstrates ability to understand and report RPE using BORG scale, to self-measure pulse accurately, and to acknowledge the importance of the exercise prescription.  Exercise Prescription Goal: Starting with aerobic activity 30 plus minutes a day, 3 days per week for initial exercise prescription. Provide home exercise prescription and guidelines that participant acknowledges understanding prior to discharge.  Activity Barriers & Risk Stratification:   6 Minute Walk:     6 Minute Walk    Row Name 11/17/16 1230         6 Minute Walk   Distance 1500 feet     Walk Time 6 minutes     # of Rest Breaks 0     MPH 2.84     METS 3.82     RPE 13  Perceived Dyspnea  3     Symptoms No     Resting HR 83 bpm     Resting BP 118/62     Resting Oxygen Saturation  94 %     Exercise Oxygen Saturation  during 6 min walk 94 %     Max Ex. HR 105 bpm     Max Ex. BP 134/56       Interval HR   3 Minute HR 97     5 Minute HR 99     6 Minute HR 105     2 Minute Post HR 80     Interval Heart Rate? Yes       Interval Oxygen   Interval Oxygen? Yes     Baseline Oxygen Saturation % 94 %     1 Minute Liters of Oxygen 0 L     2 Minute Liters of Oxygen 0 L     3 Minute Oxygen Saturation % 94 %     3 Minute Liters of Oxygen 0 L     4 Minute Liters of Oxygen 0 L     5 Minute Oxygen Saturation % 95 %     5 Minute Liters of Oxygen 0 L     6 Minute Oxygen Saturation % 94 %     6 Minute Liters of Oxygen 0 L     2 Minute Post Oxygen Saturation % 96 %     2 Minute Post Liters of Oxygen 0 L       Oxygen Initial Assessment:     Oxygen Initial Assessment - 11/17/16 1118      Home Oxygen   Home Oxygen Device None   Sleep Oxygen Prescription None   Home Exercise Oxygen Prescription None    Home at Rest Exercise Oxygen Prescription None     Initial 6 min Walk   Oxygen Used None     Program Oxygen Prescription   Program Oxygen Prescription None     Intervention   Short Term Goals To learn and understand importance of maintaining oxygen saturations>88%;To learn and demonstrate proper use of respiratory medications;To learn and demonstrate proper pursed lip breathing techniques or other breathing techniques.;To learn and understand importance of monitoring SPO2 with pulse oximeter and demonstrate accurate use of the pulse oximeter.   Long  Term Goals Maintenance of O2 saturations>88%;Compliance with respiratory medication;Demonstrates proper use of MDI's;Exhibits proper breathing techniques, such as pursed lip breathing or other method taught during program session;Verbalizes importance of monitoring SPO2 with pulse oximeter and return demonstration      Oxygen Re-Evaluation:     Oxygen Re-Evaluation    Row Name 11/20/16 1121             Goals/Expected Outcomes   Short Term Goals To learn and demonstrate proper pursed lip breathing techniques or other breathing techniques.       Long  Term Goals Exhibits proper breathing techniques, such as pursed lip breathing or other method taught during program session       Comments Reviewed PLB technique with pt.  Talked about how it work and it's important to maintaining his exercise saturations.         Goals/Expected Outcomes Short: Become more profiecient at using PLB.   Long: Become independent at using PLB.          Oxygen Discharge (Final Oxygen Re-Evaluation):     Oxygen Re-Evaluation - 11/20/16 1121      Goals/Expected Outcomes  Short Term Goals To learn and demonstrate proper pursed lip breathing techniques or other breathing techniques.   Long  Term Goals Exhibits proper breathing techniques, such as pursed lip breathing or other method taught during program session   Comments Reviewed PLB technique with pt.   Talked about how it work and it's important to maintaining his exercise saturations.     Goals/Expected Outcomes Short: Become more profiecient at using PLB.   Long: Become independent at using PLB.      Initial Exercise Prescription:     Initial Exercise Prescription - 11/17/16 1200      Date of Initial Exercise RX and Referring Provider   Date 11/17/16   Referring Provider [name]     Treadmill   MPH 2.8   Grade 1.5   Minutes 15   METs 3.72     Recumbant Bike   Level 5   RPM 60   Watts 50   Minutes 15   METs 3.75     REL-XR   Level 4   Speed 50   Minutes 15   METs 3.7     Prescription Details   Frequency (times per week) 3   Duration Progress to 45 minutes of aerobic exercise without signs/symptoms of physical distress     Intensity   THRR 40-80% of Max Heartrate 112-141   Ratings of Perceived Exertion 11-15   Perceived Dyspnea 0-4     Resistance Training   Training Prescription Yes   Weight 4   Reps 10-15      Perform Capillary Blood Glucose checks as needed.  Exercise Prescription Changes:   Exercise Comments:     Exercise Comments    Row Name 11/20/16 1121           Exercise Comments First full day of exercise!  Patient was oriented to gym and equipment including functions, settings, policies, and procedures.  Patient's individual exercise prescription and treatment plan were reviewed.  All starting workloads were established based on the results of the 6 minute walk test done at initial orientation visit.  The plan for exercise progression was also introduced and progression will be customized based on patient's performance and goals          Exercise Goals and Review:     Exercise Goals    Row Name 11/17/16 1319             Exercise Goals   Increase Physical Activity Yes       Intervention Provide advice, education, support and counseling about physical activity/exercise needs.;Develop an individualized exercise prescription for  aerobic and resistive training based on initial evaluation findings, risk stratification, comorbidities and participant's personal goals.       Expected Outcomes Achievement of increased cardiorespiratory fitness and enhanced flexibility, muscular endurance and strength shown through measurements of functional capacity and personal statement of participant.       Increase Strength and Stamina Yes       Intervention Provide advice, education, support and counseling about physical activity/exercise needs.;Develop an individualized exercise prescription for aerobic and resistive training based on initial evaluation findings, risk stratification, comorbidities and participant's personal goals.       Expected Outcomes Achievement of increased cardiorespiratory fitness and enhanced flexibility, muscular endurance and strength shown through measurements of functional capacity and personal statement of participant.       Able to understand and use rate of perceived exertion (RPE) scale Yes       Intervention Provide education  and explanation on how to use RPE scale       Expected Outcomes Long Term:  Able to use RPE to guide intensity level when exercising independently;Short Term: Able to use RPE daily in rehab to express subjective intensity level       Able to understand and use Dyspnea scale Yes       Intervention Provide education and explanation on how to use Dyspnea scale       Expected Outcomes Short Term: Able to use Dyspnea scale daily in rehab to express subjective sense of shortness of breath during exertion;Long Term: Able to use Dyspnea scale to guide intensity level when exercising independently       Knowledge and understanding of Target Heart Rate Range (THRR) Yes       Intervention Provide education and explanation of THRR including how the numbers were predicted and where they are located for reference       Expected Outcomes Short Term: Able to state/look up THRR;Long Term: Able to use THRR  to govern intensity when exercising independently;Short Term: Able to use daily as guideline for intensity in rehab       Able to check pulse independently Yes       Intervention Provide education and demonstration on how to check pulse in carotid and radial arteries.;Review the importance of being able to check your own pulse for safety during independent exercise       Expected Outcomes Short Term: Able to explain why pulse checking is important during independent exercise;Long Term: Able to check pulse independently and accurately       Understanding of Exercise Prescription Yes       Intervention Provide education, explanation, and written materials on patient's individual exercise prescription       Expected Outcomes Short Term: Able to explain program exercise prescription;Long Term: Able to explain home exercise prescription to exercise independently          Exercise Goals Re-Evaluation :     Exercise Goals Re-Evaluation    Row Name 11/20/16 1123             Exercise Goal Re-Evaluation   Exercise Goals Review Able to understand and use rate of perceived exertion (RPE) scale;Knowledge and understanding of Target Heart Rate Range (THRR);Able to understand and use Dyspnea scale;Understanding of Exercise Prescription       Comments Reviewed RPE and dyspnea scale, THR and program prescription with pt today.  Pt voiced understanding and was given a copy of goals to take home.        Expected Outcomes Short: Use RPE daily to regulate intensity.  Long: Follow program prescription in THR.          Discharge Exercise Prescription (Final Exercise Prescription Changes):   Nutrition:  Target Goals: Understanding of nutrition guidelines, daily intake of sodium 1500mg , cholesterol 200mg , calories 30% from fat and 7% or less from saturated fats, daily to have 5 or more servings of fruits and vegetables.  Biometrics:     Pre Biometrics - 11/17/16 1226      Pre Biometrics   Height 6'  1" (1.854 m)   Weight 198 lb 14.4 oz (90.2 kg)   Waist Circumference 41 inches   Hip Circumference 42 inches   Waist to Hip Ratio 0.98 %   BMI (Calculated) 26.25       Nutrition Therapy Plan and Nutrition Goals:     Nutrition Therapy & Goals - 11/17/16 1117  Nutrition Therapy   RD appointment defered Yes     Personal Nutrition Goals   Comments His Doctor guides his eating habits.     Intervention Plan   Intervention Nutrition handout(s) given to patient.;Prescribe, educate and counsel regarding individualized specific dietary modifications aiming towards targeted core components such as weight, hypertension, lipid management, diabetes, heart failure and other comorbidities.   Expected Outcomes Short Term Goal: Understand basic principles of dietary content, such as calories, fat, sodium, cholesterol and nutrients.;Short Term Goal: A plan has been developed with personal nutrition goals set during dietitian appointment.      Nutrition Discharge: Rate Your Plate Scores:   Nutrition Goals Re-Evaluation:   Nutrition Goals Discharge (Final Nutrition Goals Re-Evaluation):   Psychosocial: Target Goals: Acknowledge presence or absence of significant depression and/or stress, maximize coping skills, provide positive support system. Participant is able to verbalize types and ability to use techniques and skills needed for reducing stress and depression.   Initial Review & Psychosocial Screening:     Initial Psych Review & Screening - 11/17/16 1116      Initial Review   Current issues with History of Depression     Family Dynamics   Good Support System? Yes     Barriers   Psychosocial barriers to participate in program There are no identifiable barriers or psychosocial needs.     Screening Interventions   Interventions Encouraged to exercise;Yes   Expected Outcomes Short Term goal: Utilizing psychosocial counselor, staff and physician to assist with identification of  specific Stressors or current issues interfering with healing process. Setting desired goal for each stressor or current issue identified.;Short Term goal: Identification and review with participant of any Quality of Life or Depression concerns found by scoring the questionnaire.;Long Term goal: The participant improves quality of Life and PHQ9 Scores as seen by post scores and/or verbalization of changes;Long Term Goal: Stressors or current issues are controlled or eliminated.      Quality of Life Scores:   PHQ-9: Recent Review Flowsheet Data    Depression screen [facility] 2/9 11/17/2016   Decreased Interest 0   Down, Depressed, Hopeless 0   PHQ - 2 Score 0   Altered sleeping 0   Tired, decreased energy 1   Change in appetite 0   Feeling bad or failure about yourself  0   Trouble concentrating 0   Moving slowly or fidgety/restless 0   Suicidal thoughts 0   PHQ-9 Score 1   Difficult doing work/chores Somewhat difficult     Interpretation of Total Score  Total Score Depression Severity:  1-4 = Minimal depression, 5-9 = Mild depression, 10-14 = Moderate depression, 15-19 = Moderately severe depression, 20-27 = Severe depression   Psychosocial Evaluation and Intervention:   Psychosocial Re-Evaluation:   Psychosocial Discharge (Final Psychosocial Re-Evaluation):   Education: Education Goals: Education classes will be provided on a weekly basis, covering required topics. Participant will state understanding/return demonstration of topics presented.  Learning Barriers/Preferences:     Learning Barriers/Preferences - 11/17/16 1129      Learning Barriers/Preferences   Learning Barriers Sight   Learning Preferences None      Education Topics: Initial Evaluation Education: - Verbal, written and demonstration of respiratory meds, RPE/PD scales, oximetry and breathing techniques. Instruction on use of nebulizers and MDIs: cleaning and proper use, rinsing mouth with steroid doses and  importance of monitoring MDI activations.   Pulmonary Rehab from 11/17/2016 in [facility] Cardiac and Pulmonary Rehab  Date  11/17/16  Educator  [facility]  Instruction Review Code  1- Verbalizes Understanding      General Nutrition Guidelines/Fats and Fiber: -Group instruction provided by verbal, written material, models and posters to present the general guidelines for heart healthy nutrition. Gives an explanation and review of dietary fats and fiber.   Controlling Sodium/Reading Food Labels: -Group verbal and written material supporting the discussion of sodium use in heart healthy nutrition. Review and explanation with models, verbal and written materials for utilization of the food label.   Exercise Physiology & Risk Factors: - Group verbal and written instruction with models to review the exercise physiology of the cardiovascular system and associated critical values. Details cardiovascular disease risk factors and the goals associated with each risk factor.   Aerobic Exercise & Resistance Training: - Gives group verbal and written discussion on the health impact of inactivity. On the components of aerobic and resistive training programs and the benefits of this training and how to safely progress through these programs.   Flexibility, Balance, General Exercise Guidelines: - Provides group verbal and written instruction on the benefits of flexibility and balance training programs. Provides general exercise guidelines with specific guidelines to those with heart or lung disease. Demonstration and skill practice provided.   Stress Management: - Provides group verbal and written instruction about the health risks of elevated stress, cause of high stress, and healthy ways to reduce stress.   Depression: - Provides group verbal and written instruction on the correlation between heart/lung disease and depressed mood, treatment options, and the stigmas associated with seeking  treatment.   Exercise & Equipment Safety: - Individual verbal instruction and demonstration of equipment use and safety with use of the equipment.   Infection Prevention: - Provides verbal and written material to individual with discussion of infection control including proper hand washing and proper equipment cleaning during exercise session.   Pulmonary Rehab from 11/17/2016 in [facility] Cardiac and Pulmonary Rehab  Date  11/17/16  Educator  [facility]  Instruction Review Code  1- Verbalizes Understanding      Falls Prevention: - Provides verbal and written material to individual with discussion of falls prevention and safety.   Pulmonary Rehab from 11/17/2016 in [facility] Cardiac and Pulmonary Rehab  Date  11/17/16  Educator  [facility]  Instruction Review Code  1- Verbalizes Understanding      Diabetes: - Individual verbal and written instruction to review signs/symptoms of diabetes, desired ranges of glucose level fasting, after meals and with exercise. Advice that pre and post exercise glucose checks will be done for 3 sessions at entry of program.   Chronic Lung Diseases: - Group verbal and written instruction to review new updates, new respiratory medications, new advancements in procedures and treatments. Provide informative websites and "800" numbers of self-education.   Lung Procedures: - Group verbal and written instruction to describe testing methods done to diagnose lung disease. Review the outcome of test results. Describe the treatment choices: Pulmonary Function Tests, ABGs and oximetry.   Energy Conservation: - Provide group verbal and written instruction for methods to conserve energy, plan and organize activities. Instruct on pacing techniques, use of adaptive equipment and posture/positioning to relieve shortness of breath.   Triggers: - Group verbal and written instruction to review types of environmental controls: home humidity, furnaces, filters, dust mite/pet prevention, HEPA  vacuums. To discuss weather changes, air quality and the benefits of nasal washing.   Exacerbations: - Group verbal and written instruction to provide: warning signs, infection symptoms, calling MD promptly, preventive modes,  and value of vaccinations. Review: effective airway clearance, coughing and/or vibration techniques. Create an Sports administrator.   Oxygen: - Individual and group verbal and written instruction on oxygen therapy. Includes supplement oxygen, available portable oxygen systems, continuous and intermittent flow rates, oxygen safety, concentrators, and Medicare reimbursement for oxygen.   Pulmonary Rehab from 11/17/2016 in [facility] Cardiac and Pulmonary Rehab  Date  11/17/16  Educator  [facility]  Instruction Review Code  1- Verbalizes Understanding      Respiratory Medications: - Group verbal and written instruction to review medications for lung disease. Drug class, frequency, complications, importance of spacers, rinsing mouth after steroid MDI's, and proper cleaning methods for nebulizers.   Pulmonary Rehab from 11/17/2016 in [facility] Cardiac and Pulmonary Rehab  Date  11/17/16  Educator  [facility]  Instruction Review Code  1- Verbalizes Understanding      AED/CPR: - Group verbal and written instruction with the use of models to demonstrate the basic use of the AED with the basic ABC's of resuscitation.   Breathing Retraining: - Provides individuals verbal and written instruction on purpose, frequency, and proper technique of diaphragmatic breathing and pursed-lipped breathing. Applies individual practice skills.   Pulmonary Rehab from 11/17/2016 in [facility] Cardiac and Pulmonary Rehab  Date  11/17/16  Educator  [facility]  Instruction Review Code  1- Verbalizes Understanding      Anatomy and Physiology of the Lungs: - Group verbal and written instruction with the use of models to provide basic lung anatomy and physiology related to function, structure and complications of lung disease.   Anatomy  & Physiology of the Heart: - Group verbal and written instruction and models provide basic cardiac anatomy and physiology, with the coronary electrical and arterial systems. Review of: AMI, Angina, Valve disease, Heart Failure, Cardiac Arrhythmia, Pacemakers, and the ICD.   Heart Failure: - Group verbal and written instruction on the basics of heart failure: signs/symptoms, treatments, explanation of ejection fraction, enlarged heart and cardiomyopathy.   Sleep Apnea: - Individual verbal and written instruction to review Obstructive Sleep Apnea. Review of risk factors, methods for diagnosing and types of masks and machines for OSA.   Anxiety: - Provides group, verbal and written instruction on the correlation between heart/lung disease and anxiety, treatment options, and management of anxiety.   Relaxation: - Provides group, verbal and written instruction about the benefits of relaxation for patients with heart/lung disease. Also provides patients with examples of relaxation techniques.   Cardiac Medications: - Group verbal and written instruction to review commonly prescribed medications for heart disease. Reviews the medication, class of the drug, and side effects.   Know Your Numbers: -Group verbal and written instruction about important numbers in your health.  Review of Cholesterol, Blood Pressure, Diabetes, and BMI and the role they play in your overall health.   Other: -Provides group and verbal instruction on various topics (see comments)    Knowledge Questionnaire Score:     Knowledge Questionnaire Score - 11/17/16 1129      Knowledge Questionnaire Score   Pre Score 8/10  Reviewed with patient       Core Components/Risk Factors/Patient Goals at Admission:     Personal Goals and Risk Factors at Admission - 11/17/16 1130      Core Components/Risk Factors/Patient Goals on Admission    Weight Management Yes   Intervention Weight Management: Develop a combined  nutrition and exercise program designed to reach desired caloric intake, while maintaining appropriate intake of nutrient and fiber, sodium and fats,  and appropriate energy expenditure required for the weight goal.;Weight Management: Provide education and appropriate resources to help participant work on and attain dietary goals.;Weight Management/Obesity: Establish reasonable short term and long term weight goals.   Expected Outcomes Short Term: Continue to assess and modify interventions until short term weight is achieved;Weight Loss: Understanding of general recommendations for a balanced deficit meal plan, which promotes 1-2 lb weight loss per week and includes a negative energy balance of [phone] kcal/d;Understanding recommendations for meals to include 15-35% energy as protein, 25-35% energy from fat, 35-60% energy from carbohydrates, less than 200mg  of dietary cholesterol, 20-35 gm of total fiber daily;Understanding of distribution of calorie intake throughout the day with the consumption of 4-5 meals/snacks   Improve shortness of breath with ADL's Yes   Intervention Provide education, individualized exercise plan and daily activity instruction to help decrease symptoms of SOB with activities of daily living.   Expected Outcomes Short Term: Achieves a reduction of symptoms when performing activities of daily living.   Hypertension Yes   Intervention Provide education on lifestyle modifcations including regular physical activity/exercise, weight management, moderate sodium restriction and increased consumption of fresh fruit, vegetables, and low fat dairy, alcohol moderation, and smoking cessation.;Monitor prescription use compliance.   Expected Outcomes Short Term: Continued assessment and intervention until BP is < 140/64mm HG in hypertensive participants. < 130/33mm HG in hypertensive participants with diabetes, heart failure or chronic kidney disease.;Long Term: Maintenance of blood pressure at  goal levels.      Core Components/Risk Factors/Patient Goals Review:    Core Components/Risk Factors/Patient Goals at Discharge (Final Review):    ITP Comments:     ITP Comments    Row Name 11/17/16 1213 11/30/16 0817         ITP Comments Medical Evaluation Completed. Chart sent to Dr. Emily Filbert director of [place] for signature and review. 30 day review completed. ITP sent to Dr. Emily Filbert Director of [place]. Continue with ITP unless changes are made by physician.           Comments: 30 day review

## 2016-11-30 NOTE — Progress Notes (Signed)
Daily Session Note  Patient Details  Name: Duane Klein MRN: [id number] Date of Birth: [birth year] Referring Provider:     Pulmonary Rehab from 11/17/2016 in [facility] Cardiac and Pulmonary Rehab  Referring Provider  [name]      Encounter Date: 11/30/2016  Check In:     Session Check In - 11/30/16 1016      Check-In   Location [facility]-Cardiac & Pulmonary Rehab   Staff Present Nada Maclachlan, BA, ACSM CEP, Exercise Physiologist;[name], BS, ACSM CEP, Exercise Physiologist;Starr Urias [name]   Supervising physician immediately available to respond to emergencies [facility] immediately available ER MD   Physician(s) Dr. Clearnce Hasten and [facility]   Medication changes reported     No   Fall or balance concerns reported    No   Warm-up and Cool-down Performed as group-led instruction   Resistance Training Performed Yes   VAD Patient? No     Pain Assessment   Currently in Pain? No/denies   Multiple Pain Sites No         History  Smoking Status  . Former Smoker  . Packs/day: 3.00  . Years: 35.00  . Types: Cigarettes  . Quit date: 04/09/2002  Smokeless Tobacco  . Former Systems developer  . Types: Snuff, Chew  . Quit date: 03/09/1997    Comment: patient has not smoked since 2004    Goals Met:  Independence with exercise equipment Exercise tolerated well No report of cardiac concerns or symptoms Strength training completed today  Goals Unmet:  Not Applicable  Comments: Pt able to follow exercise prescription today without complaint.  Will continue to monitor for progression.   Dr. Emily Filbert is Medical Director for [place] and [facility].

## 2016-12-02 DIAGNOSIS — J449 Chronic obstructive pulmonary disease, unspecified: Secondary | ICD-10-CM

## 2016-12-02 NOTE — Progress Notes (Signed)
Daily Session Note  Patient Details  Name: Duane Klein MRN: [id number] Date of Birth: [birth year] Referring Provider:     Pulmonary Rehab from 11/17/2016 in [facility] Cardiac and Pulmonary Rehab  Referring Provider  [name]      Encounter Date: 12/02/2016  Check In:     Session Check In - 12/02/16 1007      Check-In   Location [facility]-Cardiac & Pulmonary Rehab   Staff Present [name], MA, ACSM RCEP, Exercise Physiologist;[name], BA, ACSM CEP, Exercise Physiologist;Shahzain Kiester [name]   Supervising physician immediately available to respond to emergencies [facility] immediately available ER MD   Physician(s) Dr. Burlene Arnt and Joni Fears   Medication changes reported     No   Fall or balance concerns reported    No   Warm-up and Cool-down Performed as group-led instruction   Resistance Training Performed Yes   VAD Patient? No     Pain Assessment   Currently in Pain? No/denies   Multiple Pain Sites No         History  Smoking Status  . Former Smoker  . Packs/day: 3.00  . Years: 35.00  . Types: Cigarettes  . Quit date: 04/09/2002  Smokeless Tobacco  . Former Systems developer  . Types: Snuff, Chew  . Quit date: 03/09/1997    Comment: patient has not smoked since 2004    Goals Met:  Independence with exercise equipment Exercise tolerated well No report of cardiac concerns or symptoms Strength training completed today  Goals Unmet:  Not Applicable  Comments: Pt able to follow exercise prescription today without complaint.  Will continue to monitor for progression. Reviewed home exercise with pt today.  Pt plans to walking 2 extra days at home for exercise.  Reviewed THR, pulse, RPE, sign and symptoms, NTG use, and when to call 911 or MD.  Also discussed weather considerations and indoor options.  Pt voiced understanding.   Dr. Emily Filbert is Medical Director for [place] and [facility].

## 2016-12-07 ENCOUNTER — Encounter: Payer: Non-veteran care | Attending: Student in an Organized Health Care Education/Training Program

## 2016-12-07 DIAGNOSIS — I4891 Unspecified atrial fibrillation: Secondary | ICD-10-CM | POA: Insufficient documentation

## 2016-12-07 DIAGNOSIS — J449 Chronic obstructive pulmonary disease, unspecified: Secondary | ICD-10-CM | POA: Diagnosis not present

## 2016-12-07 DIAGNOSIS — I10 Essential (primary) hypertension: Secondary | ICD-10-CM | POA: Diagnosis not present

## 2016-12-07 DIAGNOSIS — Z85528 Personal history of other malignant neoplasm of kidney: Secondary | ICD-10-CM | POA: Diagnosis not present

## 2016-12-07 DIAGNOSIS — Z87891 Personal history of nicotine dependence: Secondary | ICD-10-CM | POA: Insufficient documentation

## 2016-12-07 NOTE — Progress Notes (Signed)
Daily Session Note  Patient Details  Name: Duane Klein MRN: [id number] Date of Birth: 06-06-1950 Referring Provider:     Pulmonary Rehab from 11/17/2016 in [facility] Cardiac and Pulmonary Rehab  Referring Provider  [name]      Encounter Date: 12/07/2016  Check In:     Session Check In - 12/07/16 1023      Check-In   Location [facility]-Cardiac & Pulmonary Rehab   Staff Present Nada Maclachlan, BA, ACSM CEP, Exercise Physiologist;[name], BS, ACSM CEP, Exercise Physiologist;Harley Mccartney [name]   Supervising physician immediately available to respond to emergencies [facility] immediately available ER MD   Physician(s) Dr. Mariea Clonts and Alfred Levins   Medication changes reported     No   Fall or balance concerns reported    No   Warm-up and Cool-down Performed as group-led instruction   Resistance Training Performed Yes   VAD Patient? No     Pain Assessment   Currently in Pain? No/denies   Multiple Pain Sites No         History  Smoking Status  . Former Smoker  . Packs/day: 3.00  . Years: 35.00  . Types: Cigarettes  . Quit date: 04/09/2002  Smokeless Tobacco  . Former Systems developer  . Types: Snuff, Chew  . Quit date: 03/09/1997    Comment: patient has not smoked since 2004    Goals Met:  Independence with exercise equipment Exercise tolerated well No report of cardiac concerns or symptoms Strength training completed today  Goals Unmet:  Not Applicable  Comments: Pt able to follow exercise prescription today without complaint.  Will continue to monitor for progression.   Dr. Emily Filbert is Medical Director for [place] and [facility].

## 2016-12-09 DIAGNOSIS — J449 Chronic obstructive pulmonary disease, unspecified: Secondary | ICD-10-CM | POA: Diagnosis not present

## 2016-12-09 NOTE — Progress Notes (Signed)
Daily Session Note  Patient Details  Name: Duane Klein MRN: [id number] Date of Birth: 1951/01/17 Referring Provider:     Pulmonary Rehab from 11/17/2016 in [facility] Cardiac and Pulmonary Rehab  Referring Provider  [name]      Encounter Date: 12/09/2016  Check In:     Session Check In - 12/09/16 1024      Check-In   Location [facility]-Cardiac & Pulmonary Rehab   Staff Present [name], MA, ACSM RCEP, Exercise Physiologist;[name], BA, ACSM CEP, Exercise Physiologist;Xena Propst [name]   Supervising physician immediately available to respond to emergencies [facility] immediately available ER MD   Physician(s) Dr. Clearnce Hasten and [name]   Medication changes reported     No   Fall or balance concerns reported    No   Warm-up and Cool-down Performed as group-led instruction   Resistance Training Performed Yes   VAD Patient? No     Pain Assessment   Currently in Pain? No/denies   Multiple Pain Sites No         History  Smoking Status  . Former Smoker  . Packs/day: 3.00  . Years: 35.00  . Types: Cigarettes  . Quit date: 04/09/2002  Smokeless Tobacco  . Former Systems developer  . Types: Snuff, Chew  . Quit date: 03/09/1997    Comment: patient has not smoked since 2004    Goals Met:  Independence with exercise equipment Exercise tolerated well No report of cardiac concerns or symptoms Strength training completed today  Goals Unmet:  Not Applicable  Comments: Pt able to follow exercise prescription today without complaint.  Will continue to monitor for progression.   Dr. Emily Filbert is Medical Director for [place] and [facility].

## 2016-12-22 ENCOUNTER — Telehealth: Payer: Self-pay

## 2016-12-22 DIAGNOSIS — J449 Chronic obstructive pulmonary disease, unspecified: Secondary | ICD-10-CM

## 2016-12-22 NOTE — Progress Notes (Signed)
Discharge Progress Report  Patient Details  Name: Duane Klein MRN: [id number] Date of Birth: 01-20-1951 Referring Provider:     Pulmonary Rehab from 11/17/2016 in [facility] Cardiac and Pulmonary Rehab  Referring Provider  Patel       Number of Visits: 7/36  Reason for Discharge:  Early Exit:  Personal  Smoking History:  History  Smoking Status  . Former Smoker  . Packs/day: 3.00  . Years: 35.00  . Types: Cigarettes  . Quit date: 04/09/2002  Smokeless Tobacco  . Former Systems developer  . Types: Snuff, Chew  . Quit date: 03/09/1997    Comment: patient has not smoked since 2004    Diagnosis:  Chronic obstructive pulmonary disease, unspecified COPD type ([place])  ADL UCSD:     Pulmonary Assessment Scores    Row Name 11/17/16 1128         ADL UCSD   ADL Phase Entry     SOB Score total 46     Rest 0     Walk 2     Stairs 3     Bath 1     Dress 4     Shop 1       CAT Score   CAT Score 26       mMRC Score   mMRC Score 0        Initial Exercise Prescription:     Initial Exercise Prescription - 11/17/16 1200      Date of Initial Exercise RX and Referring Provider   Date 11/17/16   Referring Provider [name]     Treadmill   MPH 2.8   Grade 1.5   Minutes 15   METs 3.72     Recumbant Bike   Level 5   RPM 60   Watts 50   Minutes 15   METs 3.75     REL-XR   Level 4   Speed 50   Minutes 15   METs 3.7     Prescription Details   Frequency (times per week) 3   Duration Progress to 45 minutes of aerobic exercise without signs/symptoms of physical distress     Intensity   THRR 40-80% of Max Heartrate 112-141   Ratings of Perceived Exertion 11-15   Perceived Dyspnea 0-4     Resistance Training   Training Prescription Yes   Weight 4   Reps 10-15      Discharge Exercise Prescription (Final Exercise Prescription Changes):     Exercise Prescription Changes - 12/16/16 1100      Response to Exercise   Blood Pressure (Admit) 112/58   Blood Pressure (Exit)  126/72   Heart Rate (Admit) 81 bpm   Heart Rate (Exercise) 121 bpm   Heart Rate (Exit) 92 bpm   Oxygen Saturation (Admit) 92 %   Oxygen Saturation (Exercise) 93 %   Oxygen Saturation (Exit) 92 %   Rating of Perceived Exertion (Exercise) 13   Perceived Dyspnea (Exercise) 4   Symptoms none   Duration Continue with 45 min of aerobic exercise without signs/symptoms of physical distress.   Intensity THRR unchanged     Progression   Progression Continue to progress workloads to maintain intensity without signs/symptoms of physical distress.   Average METs 4.6     Resistance Training   Training Prescription Yes   Weight 4   Reps 10-15     Interval Training   Interval Training No     Recumbant Bike   Level 9  RPM 47   Watts 40   Minutes 15   METs 3.57     REL-XR   Level 7   Speed 67   Minutes 15   METs 5.8      Functional Capacity:     6 Minute Walk    Row Name 11/17/16 1230         6 Minute Walk   Distance 1500 feet     Walk Time 6 minutes     # of Rest Breaks 0     MPH 2.84     METS 3.82     RPE 13     Perceived Dyspnea  3     Symptoms No     Resting HR 83 bpm     Resting BP 118/62     Resting Oxygen Saturation  94 %     Exercise Oxygen Saturation  during 6 min walk 94 %     Max Ex. HR 105 bpm     Max Ex. BP 134/56       Interval HR   3 Minute HR 97     5 Minute HR 99     6 Minute HR 105     2 Minute Post HR 80     Interval Heart Rate? Yes       Interval Oxygen   Interval Oxygen? Yes     Baseline Oxygen Saturation % 94 %     1 Minute Liters of Oxygen 0 L     2 Minute Liters of Oxygen 0 L     3 Minute Oxygen Saturation % 94 %     3 Minute Liters of Oxygen 0 L     4 Minute Liters of Oxygen 0 L     5 Minute Oxygen Saturation % 95 %     5 Minute Liters of Oxygen 0 L     6 Minute Oxygen Saturation % 94 %     6 Minute Liters of Oxygen 0 L     2 Minute Post Oxygen Saturation % 96 %     2 Minute Post Liters of Oxygen 0 L        Psychological,  QOL, Others - Outcomes: PHQ 2/9: Depression screen PHQ 2/9 11/17/2016  Decreased Interest 0  Down, Depressed, Hopeless 0  PHQ - 2 Score 0  Altered sleeping 0  Tired, decreased energy 1  Change in appetite 0  Feeling bad or failure about yourself  0  Trouble concentrating 0  Moving slowly or fidgety/restless 0  Suicidal thoughts 0  PHQ-9 Score 1  Difficult doing work/chores Somewhat difficult    Quality of Life:   Personal Goals: Goals established at orientation with interventions provided to work toward goal.     Personal Goals and Risk Factors at Admission - 11/17/16 1130      Core Components/Risk Factors/Patient Goals on Admission    Weight Management Yes   Intervention Weight Management: Develop a combined nutrition and exercise program designed to reach desired caloric intake, while maintaining appropriate intake of nutrient and fiber, sodium and fats, and appropriate energy expenditure required for the weight goal.;Weight Management: Provide education and appropriate resources to help participant work on and attain dietary goals.;Weight Management/Obesity: Establish reasonable short term and long term weight goals.   Expected Outcomes Short Term: Continue to assess and modify interventions until short term weight is achieved;Weight Loss: Understanding of general recommendations for a balanced deficit meal plan, which promotes 1-2 lb  weight loss per week and includes a negative energy balance of [phone] kcal/d;Understanding recommendations for meals to include 15-35% energy as protein, 25-35% energy from fat, 35-60% energy from carbohydrates, less than 200mg  of dietary cholesterol, 20-35 gm of total fiber daily;Understanding of distribution of calorie intake throughout the day with the consumption of 4-5 meals/snacks   Improve shortness of breath with ADL's Yes   Intervention Provide education, individualized exercise plan and daily activity instruction to help decrease symptoms of  SOB with activities of daily living.   Expected Outcomes Short Term: Achieves a reduction of symptoms when performing activities of daily living.   Hypertension Yes   Intervention Provide education on lifestyle modifcations including regular physical activity/exercise, weight management, moderate sodium restriction and increased consumption of fresh fruit, vegetables, and low fat dairy, alcohol moderation, and smoking cessation.;Monitor prescription use compliance.   Expected Outcomes Short Term: Continued assessment and intervention until BP is < 140/26mm HG in hypertensive participants. < 130/59mm HG in hypertensive participants with diabetes, heart failure or chronic kidney disease.;Long Term: Maintenance of blood pressure at goal levels.       Personal Goals Discharge:   Exercise Goals and Review:     Exercise Goals    Row Name 11/17/16 1319             Exercise Goals   Increase Physical Activity Yes       Intervention Provide advice, education, support and counseling about physical activity/exercise needs.;Develop an individualized exercise prescription for aerobic and resistive training based on initial evaluation findings, risk stratification, comorbidities and participant's personal goals.       Expected Outcomes Achievement of increased cardiorespiratory fitness and enhanced flexibility, muscular endurance and strength shown through measurements of functional capacity and personal statement of participant.       Increase Strength and Stamina Yes       Intervention Provide advice, education, support and counseling about physical activity/exercise needs.;Develop an individualized exercise prescription for aerobic and resistive training based on initial evaluation findings, risk stratification, comorbidities and participant's personal goals.       Expected Outcomes Achievement of increased cardiorespiratory fitness and enhanced flexibility, muscular endurance and strength shown  through measurements of functional capacity and personal statement of participant.       Able to understand and use rate of perceived exertion (RPE) scale Yes       Intervention Provide education and explanation on how to use RPE scale       Expected Outcomes Long Term:  Able to use RPE to guide intensity level when exercising independently;Short Term: Able to use RPE daily in rehab to express subjective intensity level       Able to understand and use Dyspnea scale Yes       Intervention Provide education and explanation on how to use Dyspnea scale       Expected Outcomes Short Term: Able to use Dyspnea scale daily in rehab to express subjective sense of shortness of breath during exertion;Long Term: Able to use Dyspnea scale to guide intensity level when exercising independently       Knowledge and understanding of Target Heart Rate Range (THRR) Yes       Intervention Provide education and explanation of THRR including how the numbers were predicted and where they are located for reference       Expected Outcomes Short Term: Able to state/look up THRR;Long Term: Able to use THRR to govern intensity when exercising independently;Short Term: Able to use daily  as guideline for intensity in rehab       Able to check pulse independently Yes       Intervention Provide education and demonstration on how to check pulse in carotid and radial arteries.;Review the importance of being able to check your own pulse for safety during independent exercise       Expected Outcomes Short Term: Able to explain why pulse checking is important during independent exercise;Long Term: Able to check pulse independently and accurately       Understanding of Exercise Prescription Yes       Intervention Provide education, explanation, and written materials on patient's individual exercise prescription       Expected Outcomes Short Term: Able to explain program exercise prescription;Long Term: Able to explain home exercise  prescription to exercise independently          Nutrition & Weight - Outcomes:     Pre Biometrics - 11/17/16 1226      Pre Biometrics   Height 6\' 1"  (1.854 m)   Weight 198 lb 14.4 oz (90.2 kg)   Waist Circumference 41 inches   Hip Circumference 42 inches   Waist to Hip Ratio 0.98 %   BMI (Calculated) 26.25       Nutrition:     Nutrition Therapy & Goals - 11/17/16 1117      Nutrition Therapy   RD appointment defered Yes     Personal Nutrition Goals   Comments His Doctor guides his eating habits.     Intervention Plan   Intervention Nutrition handout(s) given to patient.;Prescribe, educate and counsel regarding individualized specific dietary modifications aiming towards targeted core components such as weight, hypertension, lipid management, diabetes, heart failure and other comorbidities.   Expected Outcomes Short Term Goal: Understand basic principles of dietary content, such as calories, fat, sodium, cholesterol and nutrients.;Short Term Goal: A plan has been developed with personal nutrition goals set during dietitian appointment.      Nutrition Discharge:   Education Questionnaire Score:     Knowledge Questionnaire Score - 11/17/16 1129      Knowledge Questionnaire Score   Pre Score 8/10  Reviewed with patient      Goals reviewed with patient; copy given to patient.

## 2016-12-22 NOTE — Telephone Encounter (Signed)
Called Duane Klein today to see if he was ok. He states that he is not coming back to [place] but appreciates all we did for him. He says he is working out on his own. I informed him that if he would like to come back he would need another referral.

## 2016-12-22 NOTE — Progress Notes (Signed)
Pulmonary Individual Treatment Plan  Patient Details  Name: Duane Klein MRN: [id number] Date of Birth: 10/05/1950 Referring Provider:     Pulmonary Rehab from 11/17/2016 in [facility] Cardiac and Pulmonary Rehab  Referring Provider  [name]      Initial Encounter Date:    Pulmonary Rehab from 11/17/2016 in [facility] Cardiac and Pulmonary Rehab  Date  11/17/16  Referring Provider  [name]      Visit Diagnosis: Chronic obstructive pulmonary disease, unspecified COPD type ([place])  Patient's Home Medications on Admission:  Current Outpatient Prescriptions:  .  ondansetron (ZOFRAN ODT) 4 MG disintegrating tablet, Take 1 tablet (4 mg total) by mouth every 8 (eight) hours as needed for nausea or vomiting., Disp: 10 tablet, Rfl: 0  Past Medical History: Past Medical History:  Diagnosis Date  . Atrial fibrillation ([place])   . Cancer of kidney ([place])   . Emphysema lung ([place])   . Hypertension     Tobacco Use: History  Smoking Status  . Former Smoker  . Packs/day: 3.00  . Years: 35.00  . Types: Cigarettes  . Quit date: 04/09/2002  Smokeless Tobacco  . Former Systems developer  . Types: Snuff, Chew  . Quit date: 03/09/1997    Comment: patient has not smoked since 2004    Labs: Recent Review Flowsheet Data    There is no flowsheet data to display.       Pulmonary Assessment Scores:     Pulmonary Assessment Scores    Row Name 11/17/16 1128         ADL UCSD   ADL Phase Entry     SOB Score total 46     Rest 0     Walk 2     Stairs 3     Bath 1     Dress 4     Shop 1       CAT Score   CAT Score 26       mMRC Score   mMRC Score 0        Pulmonary Function Assessment:     Pulmonary Function Assessment - 11/17/16 1125      Initial Spirometry Results   FVC% 56 %   FEV1% 52 %   FEV1/FVC Ratio 69.55   Comments Good effort     Post Bronchodilator Spirometry Results   FVC% 48.16 %   FEV1% 34.32 %   FEV1/FVC Ratio 53.51   Comments Good effort. best of 2     Breath   Bilateral  Breath Sounds Clear   Shortness of Breath Limiting activity      Exercise Target Goals:    Exercise Program Goal: Individual exercise prescription set with THRR, safety & activity barriers. Participant demonstrates ability to understand and report RPE using BORG scale, to self-measure pulse accurately, and to acknowledge the importance of the exercise prescription.  Exercise Prescription Goal: Starting with aerobic activity 30 plus minutes a day, 3 days per week for initial exercise prescription. Provide home exercise prescription and guidelines that participant acknowledges understanding prior to discharge.  Activity Barriers & Risk Stratification:   6 Minute Walk:     6 Minute Walk    Row Name 11/17/16 1230         6 Minute Walk   Distance 1500 feet     Walk Time 6 minutes     # of Rest Breaks 0     MPH 2.84     METS 3.82     RPE 13  Perceived Dyspnea  3     Symptoms No     Resting HR 83 bpm     Resting BP 118/62     Resting Oxygen Saturation  94 %     Exercise Oxygen Saturation  during 6 min walk 94 %     Max Ex. HR 105 bpm     Max Ex. BP 134/56       Interval HR   3 Minute HR 97     5 Minute HR 99     6 Minute HR 105     2 Minute Post HR 80     Interval Heart Rate? Yes       Interval Oxygen   Interval Oxygen? Yes     Baseline Oxygen Saturation % 94 %     1 Minute Liters of Oxygen 0 L     2 Minute Liters of Oxygen 0 L     3 Minute Oxygen Saturation % 94 %     3 Minute Liters of Oxygen 0 L     4 Minute Liters of Oxygen 0 L     5 Minute Oxygen Saturation % 95 %     5 Minute Liters of Oxygen 0 L     6 Minute Oxygen Saturation % 94 %     6 Minute Liters of Oxygen 0 L     2 Minute Post Oxygen Saturation % 96 %     2 Minute Post Liters of Oxygen 0 L       Oxygen Initial Assessment:     Oxygen Initial Assessment - 11/17/16 1118      Home Oxygen   Home Oxygen Device None   Sleep Oxygen Prescription None   Home Exercise Oxygen Prescription None    Home at Rest Exercise Oxygen Prescription None     Initial 6 min Walk   Oxygen Used None     Program Oxygen Prescription   Program Oxygen Prescription None     Intervention   Short Term Goals To learn and understand importance of maintaining oxygen saturations>88%;To learn and demonstrate proper use of respiratory medications;To learn and demonstrate proper pursed lip breathing techniques or other breathing techniques.;To learn and understand importance of monitoring SPO2 with pulse oximeter and demonstrate accurate use of the pulse oximeter.   Long  Term Goals Maintenance of O2 saturations>88%;Compliance with respiratory medication;Demonstrates proper use of MDI's;Exhibits proper breathing techniques, such as pursed lip breathing or other method taught during program session;Verbalizes importance of monitoring SPO2 with pulse oximeter and return demonstration      Oxygen Re-Evaluation:     Oxygen Re-Evaluation    Row Name 11/20/16 1121             Goals/Expected Outcomes   Short Term Goals To learn and demonstrate proper pursed lip breathing techniques or other breathing techniques.       Long  Term Goals Exhibits proper breathing techniques, such as pursed lip breathing or other method taught during program session       Comments Reviewed PLB technique with pt.  Talked about how it work and it's important to maintaining his exercise saturations.         Goals/Expected Outcomes Short: Become more profiecient at using PLB.   Long: Become independent at using PLB.          Oxygen Discharge (Final Oxygen Re-Evaluation):     Oxygen Re-Evaluation - 11/20/16 1121      Goals/Expected Outcomes  Short Term Goals To learn and demonstrate proper pursed lip breathing techniques or other breathing techniques.   Long  Term Goals Exhibits proper breathing techniques, such as pursed lip breathing or other method taught during program session   Comments Reviewed PLB technique with pt.   Talked about how it work and it's important to maintaining his exercise saturations.     Goals/Expected Outcomes Short: Become more profiecient at using PLB.   Long: Become independent at using PLB.      Initial Exercise Prescription:     Initial Exercise Prescription - 11/17/16 1200      Date of Initial Exercise RX and Referring Provider   Date 11/17/16   Referring Provider [name]     Treadmill   MPH 2.8   Grade 1.5   Minutes 15   METs 3.72     Recumbant Bike   Level 5   RPM 60   Watts 50   Minutes 15   METs 3.75     REL-XR   Level 4   Speed 50   Minutes 15   METs 3.7     Prescription Details   Frequency (times per week) 3   Duration Progress to 45 minutes of aerobic exercise without signs/symptoms of physical distress     Intensity   THRR 40-80% of Max Heartrate 112-141   Ratings of Perceived Exertion 11-15   Perceived Dyspnea 0-4     Resistance Training   Training Prescription Yes   Weight 4   Reps 10-15      Perform Capillary Blood Glucose checks as needed.  Exercise Prescription Changes:      Exercise Prescription Changes    Row Name 12/02/16 1100 12/02/16 1200 12/16/16 1100         Response to Exercise   Blood Pressure (Admit)  - 126/84 112/58     Blood Pressure (Exit)  - 100/58 126/72     Heart Rate (Admit)  - 95 bpm 81 bpm     Heart Rate (Exercise)  - 116 bpm 121 bpm     Heart Rate (Exit)  - 77 bpm 92 bpm     Oxygen Saturation (Admit)  - 95 % 92 %     Oxygen Saturation (Exercise)  - 95 % 93 %     Oxygen Saturation (Exit)  - 93 % 92 %     Rating of Perceived Exertion (Exercise)  - 12 13     Perceived Dyspnea (Exercise)  - 2 4     Symptoms  - none none     Duration  - Continue with 45 min of aerobic exercise without signs/symptoms of physical distress. Continue with 45 min of aerobic exercise without signs/symptoms of physical distress.     Intensity  - THRR unchanged THRR unchanged       Progression   Progression  - Continue to  progress workloads to maintain intensity without signs/symptoms of physical distress. Continue to progress workloads to maintain intensity without signs/symptoms of physical distress.     Average METs  - 4.36 4.6       Resistance Training   Training Prescription Yes Yes Yes     Weight 4 4 4      Reps 10-15 10-15 10-15       Interval Training   Interval Training  - No No       Treadmill   MPH 2.8 2.8  -     Grade 1.5 1.5  -  Minutes 15 15  -     METs 3.72 3.72  -       Recumbant Bike   Level 5 5 9      RPM 60 75 47     Watts 50 46 40     Minutes 15 15 15      METs 3.75 3.75 3.57       REL-XR   Level 4 6 7      Speed 50 70 67     Minutes 15 15 15      METs 3.7 4.8 5.8       Home Exercise Plan   Plans to continue exercise at Home (comment)  -  -     Frequency Add 2 additional days to program exercise sessions.  -  -     Initial Home Exercises Provided 12/02/16  -  -        Exercise Comments:      Exercise Comments    Row Name 11/20/16 1121 12/02/16 1118         Exercise Comments First full day of exercise!  Patient was oriented to gym and equipment including functions, settings, policies, and procedures.  Patient's individual exercise prescription and treatment plan were reviewed.  All starting workloads were established based on the results of the 6 minute walk test done at initial orientation visit.  The plan for exercise progression was also introduced and progression will be customized based on patient's performance and goals Reviewed home exercise with pt today.  Pt plans to walking 2 extra days at home for exercise.  Reviewed THR, pulse, RPE, sign and symptoms, NTG use, and when to call 911 or MD.  Also discussed weather considerations and indoor options.  Pt voiced understanding.         Exercise Goals and Review:      Exercise Goals    Row Name 11/17/16 1319             Exercise Goals   Increase Physical Activity Yes       Intervention Provide advice,  education, support and counseling about physical activity/exercise needs.;Develop an individualized exercise prescription for aerobic and resistive training based on initial evaluation findings, risk stratification, comorbidities and participant's personal goals.       Expected Outcomes Achievement of increased cardiorespiratory fitness and enhanced flexibility, muscular endurance and strength shown through measurements of functional capacity and personal statement of participant.       Increase Strength and Stamina Yes       Intervention Provide advice, education, support and counseling about physical activity/exercise needs.;Develop an individualized exercise prescription for aerobic and resistive training based on initial evaluation findings, risk stratification, comorbidities and participant's personal goals.       Expected Outcomes Achievement of increased cardiorespiratory fitness and enhanced flexibility, muscular endurance and strength shown through measurements of functional capacity and personal statement of participant.       Able to understand and use rate of perceived exertion (RPE) scale Yes       Intervention Provide education and explanation on how to use RPE scale       Expected Outcomes Long Term:  Able to use RPE to guide intensity level when exercising independently;Short Term: Able to use RPE daily in rehab to express subjective intensity level       Able to understand and use Dyspnea scale Yes       Intervention Provide education and explanation on how to use Dyspnea scale  Expected Outcomes Short Term: Able to use Dyspnea scale daily in rehab to express subjective sense of shortness of breath during exertion;Long Term: Able to use Dyspnea scale to guide intensity level when exercising independently       Knowledge and understanding of Target Heart Rate Range (THRR) Yes       Intervention Provide education and explanation of THRR including how the numbers were predicted and  where they are located for reference       Expected Outcomes Short Term: Able to state/look up THRR;Long Term: Able to use THRR to govern intensity when exercising independently;Short Term: Able to use daily as guideline for intensity in rehab       Able to check pulse independently Yes       Intervention Provide education and demonstration on how to check pulse in carotid and radial arteries.;Review the importance of being able to check your own pulse for safety during independent exercise       Expected Outcomes Short Term: Able to explain why pulse checking is important during independent exercise;Long Term: Able to check pulse independently and accurately       Understanding of Exercise Prescription Yes       Intervention Provide education, explanation, and written materials on patient's individual exercise prescription       Expected Outcomes Short Term: Able to explain program exercise prescription;Long Term: Able to explain home exercise prescription to exercise independently          Exercise Goals Re-Evaluation :     Exercise Goals Re-Evaluation    Row Name 11/20/16 1123 12/02/16 1118 12/02/16 1219 12/16/16 1118       Exercise Goal Re-Evaluation   Exercise Goals Review Able to understand and use rate of perceived exertion (RPE) scale;Knowledge and understanding of Target Heart Rate Range (THRR);Able to understand and use Dyspnea scale;Understanding of Exercise Prescription Increase Physical Activity;Able to understand and use Dyspnea scale;Understanding of Exercise Prescription;Increase Strength and Stamina;Knowledge and understanding of Target Heart Rate Range (THRR);Able to check pulse independently;Able to understand and use rate of perceived exertion (RPE) scale Increase Physical Activity;Increase Strength and Stamina Increase Physical Activity;Increase Strength and Stamina    Comments Reviewed RPE and dyspnea scale, THR and program prescription with pt today.  Pt voiced  understanding and was given a copy of goals to take home.  Reviewed home exercise with pt today.  Pt plans to walking 2 extra days at home for exercise.  Reviewed THR, pulse, RPE, sign and symptoms, NTG use, and when to call 911 or MD.  Also discussed weather considerations and indoor options.  Pt voiced understanding. Lora is progressing well with exercise.  He has increased levels on all machines. Colbe is progressing well and has increased overall MET level.      Expected Outcomes Short: Use RPE daily to regulate intensity.  Long: Follow program prescription in THR. Short: add 2 days of walking at home. Long Maintain a workout routine post [facility]. Short - Nasser will continue to increase workloads and improve fitness.  Long - Paulo will reach higher overall MET level and maintain exercise after LW. Short - Khyrin will continue to attend regularly.  Long - Alphonza will continue to improve over all fitness.       Discharge Exercise Prescription (Final Exercise Prescription Changes):     Exercise Prescription Changes - 12/16/16 1100      Response to Exercise   Blood Pressure (Admit) 112/58   Blood Pressure (Exit) 126/72  Heart Rate (Admit) 81 bpm   Heart Rate (Exercise) 121 bpm   Heart Rate (Exit) 92 bpm   Oxygen Saturation (Admit) 92 %   Oxygen Saturation (Exercise) 93 %   Oxygen Saturation (Exit) 92 %   Rating of Perceived Exertion (Exercise) 13   Perceived Dyspnea (Exercise) 4   Symptoms none   Duration Continue with 45 min of aerobic exercise without signs/symptoms of physical distress.   Intensity THRR unchanged     Progression   Progression Continue to progress workloads to maintain intensity without signs/symptoms of physical distress.   Average METs 4.6     Resistance Training   Training Prescription Yes   Weight 4   Reps 10-15     Interval Training   Interval Training No     Recumbant Bike   Level 9   RPM 47   Watts 40   Minutes 15   METs 3.57     REL-XR   Level 7    Speed 67   Minutes 15   METs 5.8      Nutrition:  Target Goals: Understanding of nutrition guidelines, daily intake of sodium <1534m, cholesterol <2064m calories 30% from fat and 7% or less from saturated fats, daily to have 5 or more servings of fruits and vegetables.  Biometrics:     Pre Biometrics - 11/17/16 1226      Pre Biometrics   Height 6' 1"  (1.854 m)   Weight 198 lb 14.4 oz (90.2 kg)   Waist Circumference 41 inches   Hip Circumference 42 inches   Waist to Hip Ratio 0.98 %   BMI (Calculated) 26.25       Nutrition Therapy Plan and Nutrition Goals:     Nutrition Therapy & Goals - 11/17/16 1117      Nutrition Therapy   RD appointment defered Yes     Personal Nutrition Goals   Comments His Doctor guides his eating habits.     Intervention Plan   Intervention Nutrition handout(s) given to patient.;Prescribe, educate and counsel regarding individualized specific dietary modifications aiming towards targeted core components such as weight, hypertension, lipid management, diabetes, heart failure and other comorbidities.   Expected Outcomes Short Term Goal: Understand basic principles of dietary content, such as calories, fat, sodium, cholesterol and nutrients.;Short Term Goal: A plan has been developed with personal nutrition goals set during dietitian appointment.      Nutrition Discharge: Rate Your Plate Scores:   Nutrition Goals Re-Evaluation:   Nutrition Goals Discharge (Final Nutrition Goals Re-Evaluation):   Psychosocial: Target Goals: Acknowledge presence or absence of significant depression and/or stress, maximize coping skills, provide positive support system. Participant is able to verbalize types and ability to use techniques and skills needed for reducing stress and depression.   Initial Review & Psychosocial Screening:     Initial Psych Review & Screening - 11/17/16 1116      Initial Review   Current issues with History of Depression      Family Dynamics   Good Support System? Yes     Barriers   Psychosocial barriers to participate in program There are no identifiable barriers or psychosocial needs.     Screening Interventions   Interventions Encouraged to exercise;Yes   Expected Outcomes Short Term goal: Utilizing psychosocial counselor, staff and physician to assist with identification of specific Stressors or current issues interfering with healing process. Setting desired goal for each stressor or current issue identified.;Short Term goal: Identification and review with participant of any  Quality of Life or Depression concerns found by scoring the questionnaire.;Long Term goal: The participant improves quality of Life and PHQ9 Scores as seen by post scores and/or verbalization of changes;Long Term Goal: Stressors or current issues are controlled or eliminated.      Quality of Life Scores:   PHQ-9: Recent Review Flowsheet Data    Depression screen [facility] 2/9 11/17/2016   Decreased Interest 0   Down, Depressed, Hopeless 0   PHQ - 2 Score 0   Altered sleeping 0   Tired, decreased energy 1   Change in appetite 0   Feeling bad or failure about yourself  0   Trouble concentrating 0   Moving slowly or fidgety/restless 0   Suicidal thoughts 0   PHQ-9 Score 1   Difficult doing work/chores Somewhat difficult     Interpretation of Total Score  Total Score Depression Severity:  1-4 = Minimal depression, 5-9 = Mild depression, 10-14 = Moderate depression, 15-19 = Moderately severe depression, 20-27 = Severe depression   Psychosocial Evaluation and Intervention:     Psychosocial Evaluation - 11/30/16 1049      Psychosocial Evaluation & Interventions   Interventions Encouraged to exercise with the program and follow exercise prescription   Comments Counselor met with Mr. Rape (Matas) today for initial psychsocial evaluation.  He is a 66 year old who  has struggled with Emphysema for the past 4-5 years.  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  He reports sleeping approximately 6 hrs per night and although he has sleep apnea; it is "dangerous" for him to wear his CPAP as he has been known to flail around and it scares his spouse.  Lavert states his appetite is good and his mood is generally positive.  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  He denies any stress in his life other than his health.  He has goals to improve his breathing and increase his stamina while in this program.  He will be followed by staff.      Expected Outcomes Joffrey will benefit from consistently exercise to achieve his stated goals.  He plans to being working out at a [employer] on his "off" days here over the next few weeks.  The educational and psychoeducational components of this program will be helpful in understanding and managing his disease more positively.     Continue Psychosocial Services  Follow up required by staff      Psychosocial Re-Evaluation:   Psychosocial Discharge (Final Psychosocial Re-Evaluation):   Education: Education Goals: Education classes will be provided on a weekly basis, covering required topics. Participant will state understanding/return demonstration of topics presented.  Learning Barriers/Preferences:     Learning Barriers/Preferences - 11/17/16 1129      Learning Barriers/Preferences   Learning Barriers Sight   Learning Preferences None      Education Topics: Initial Evaluation Education: - Verbal, written and demonstration of respiratory meds, RPE/PD scales, oximetry and breathing techniques. Instruction on use of nebulizers and MDIs: cleaning and proper use, rinsing mouth with steroid doses and  importance of monitoring MDI activations.   Pulmonary Rehab from 12/09/2016 in [facility] Cardiac and Pulmonary Rehab  Date  11/17/16  Educator  [place]  Instruction Review Code  1- Verbalizes Understanding      General Nutrition Guidelines/Fats and Fiber: -Group instruction provided by verbal, written material, models and posters to present the general guidelines for heart healthy nutrition. Gives an explanation and review of dietary fats and fiber.   Controlling Sodium/Reading Food Labels: -Group verbal and written material supporting the discussion of sodium use in heart healthy nutrition. Review and explanation with models, verbal and written materials for utilization of the food label.   Exercise Physiology & Risk Factors: - Group verbal and written instruction with models to review the exercise physiology of the cardiovascular system and associated critical values. Details cardiovascular disease risk factors and the goals associated with each risk factor.   Aerobic Exercise & Resistance Training: - Gives group verbal and written discussion on the health impact of inactivity. On the components of aerobic and resistive training programs and the benefits of this training and how to safely progress through these programs.   Flexibility, Balance, General Exercise Guidelines: - Provides group verbal and written instruction on the benefits of flexibility and balance training programs. Provides general exercise guidelines with specific guidelines to those with heart or lung disease. Demonstration and skill practice provided.   Stress Management: - Provides group verbal and written instruction about the health risks of elevated stress, cause of high stress, and healthy ways to reduce stress.   Depression: - Provides group verbal and written instruction on the correlation between heart/lung disease and depressed mood, treatment options, and the stigmas associated with seeking  treatment.   Exercise & Equipment Safety: - Individual verbal instruction and demonstration of equipment use and safety with use of the equipment.   Infection Prevention: - Provides verbal and written material to individual with discussion of infection control including proper hand washing and proper equipment cleaning during exercise session.   Pulmonary Rehab from 12/09/2016 in [facility] Cardiac and Pulmonary Rehab  Date  11/17/16  Educator  [facility]  Instruction Review Code  1- Verbalizes Understanding      Falls Prevention: - Provides verbal and written material to individual with discussion of falls prevention and safety.   Pulmonary Rehab from 12/09/2016 in [facility] Cardiac and Pulmonary Rehab  Date  11/17/16  Educator  [facility]  Instruction Review Code  1- Verbalizes Understanding      Diabetes: - Individual verbal and written instruction to review signs/symptoms of diabetes, desired ranges of glucose level fasting, after meals and with exercise. Advice that pre and post exercise glucose checks will be done for 3 sessions at entry of program.   Chronic Lung Diseases: - Group verbal and written instruction to review new updates, new respiratory medications, new advancements in procedures and treatments. Provide informative websites and "800" numbers of self-education.   Pulmonary Rehab from 12/09/2016 in [facility] Cardiac and Pulmonary Rehab  Date  12/09/16  Educator  [facility]  Instruction Review Code  1- Verbalizes Understanding      Lung Procedures: - Group verbal and written instruction to describe testing methods done to diagnose lung disease. Review the outcome of test results. Describe the treatment choices: Pulmonary Function Tests, ABGs and oximetry.   Energy Conservation: - Provide group verbal and written instruction for methods to conserve energy, plan and organize activities. Instruct on pacing techniques, use of adaptive equipment and posture/positioning to relieve shortness of  breath.   Triggers: - Group verbal and written instruction to review types of environmental controls: home humidity, furnaces, filters, dust mite/pet  prevention, HEPA vacuums. To discuss weather changes, air quality and the benefits of nasal washing.   Exacerbations: - Group verbal and written instruction to provide: warning signs, infection symptoms, calling MD promptly, preventive modes, and value of vaccinations. Review: effective airway clearance, coughing and/or vibration techniques. Create an Sports administrator.   Oxygen: - Individual and group verbal and written instruction on oxygen therapy. Includes supplement oxygen, available portable oxygen systems, continuous and intermittent flow rates, oxygen safety, concentrators, and Medicare reimbursement for oxygen.   Pulmonary Rehab from 12/09/2016 in [facility] Cardiac and Pulmonary Rehab  Date  11/17/16  Educator  [facility]  Instruction Review Code  1- Verbalizes Understanding      Respiratory Medications: - Group verbal and written instruction to review medications for lung disease. Drug class, frequency, complications, importance of spacers, rinsing mouth after steroid MDI's, and proper cleaning methods for nebulizers.   Pulmonary Rehab from 12/09/2016 in [facility] Cardiac and Pulmonary Rehab  Date  11/17/16  Educator  [facility]  Instruction Review Code  1- Verbalizes Understanding      AED/CPR: - Group verbal and written instruction with the use of models to demonstrate the basic use of the AED with the basic ABC's of resuscitation.   Breathing Retraining: - Provides individuals verbal and written instruction on purpose, frequency, and proper technique of diaphragmatic breathing and pursed-lipped breathing. Applies individual practice skills.   Pulmonary Rehab from 12/09/2016 in [facility] Cardiac and Pulmonary Rehab  Date  11/17/16  Educator  [facility]  Instruction Review Code  1- Verbalizes Understanding      Anatomy and Physiology of the Lungs: - Group verbal  and written instruction with the use of models to provide basic lung anatomy and physiology related to function, structure and complications of lung disease.   Anatomy & Physiology of the Heart: - Group verbal and written instruction and models provide basic cardiac anatomy and physiology, with the coronary electrical and arterial systems. Review of: AMI, Angina, Valve disease, Heart Failure, Cardiac Arrhythmia, Pacemakers, and the ICD.   Heart Failure: - Group verbal and written instruction on the basics of heart failure: signs/symptoms, treatments, explanation of ejection fraction, enlarged heart and cardiomyopathy.   Sleep Apnea: - Individual verbal and written instruction to review Obstructive Sleep Apnea. Review of risk factors, methods for diagnosing and types of masks and machines for OSA.   Anxiety: - Provides group, verbal and written instruction on the correlation between heart/lung disease and anxiety, treatment options, and management of anxiety.   Relaxation: - Provides group, verbal and written instruction about the benefits of relaxation for patients with heart/lung disease. Also provides patients with examples of relaxation techniques.   Cardiac Medications: - Group verbal and written instruction to review commonly prescribed medications for heart disease. Reviews the medication, class of the drug, and side effects.   Know Your Numbers: -Group verbal and written instruction about important numbers in your health.  Review of Cholesterol, Blood Pressure, Diabetes, and BMI and the role they play in your overall health.   Other: -Provides group and verbal instruction on various topics (see comments)    Knowledge Questionnaire Score:     Knowledge Questionnaire Score - 11/17/16 1129      Knowledge Questionnaire Score   Pre Score 8/10  Reviewed with patient       Core Components/Risk Factors/Patient Goals at Admission:     Personal Goals and Risk Factors at  Admission - 11/17/16 1130      Core Components/Risk Factors/Patient Goals on Admission  Weight Management Yes   Intervention Weight Management: Develop a combined nutrition and exercise program designed to reach desired caloric intake, while maintaining appropriate intake of nutrient and fiber, sodium and fats, and appropriate energy expenditure required for the weight goal.;Weight Management: Provide education and appropriate resources to help participant work on and attain dietary goals.;Weight Management/Obesity: Establish reasonable short term and long term weight goals.   Expected Outcomes Short Term: Continue to assess and modify interventions until short term weight is achieved;Weight Loss: Understanding of general recommendations for a balanced deficit meal plan, which promotes 1-2 lb weight loss per week and includes a negative energy balance of [id number] kcal/d;Understanding recommendations for meals to include 15-35% energy as protein, 25-35% energy from fat, 35-60% energy from carbohydrates, less than 21m of dietary cholesterol, 20-35 gm of total fiber daily;Understanding of distribution of calorie intake throughout the day with the consumption of 4-5 meals/snacks   Improve shortness of breath with ADL's Yes   Intervention Provide education, individualized exercise plan and daily activity instruction to help decrease symptoms of SOB with activities of daily living.   Expected Outcomes Short Term: Achieves a reduction of symptoms when performing activities of daily living.   Hypertension Yes   Intervention Provide education on lifestyle modifcations including regular physical activity/exercise, weight management, moderate sodium restriction and increased consumption of fresh fruit, vegetables, and low fat dairy, alcohol moderation, and smoking cessation.;Monitor prescription use compliance.   Expected Outcomes Short Term: Continued assessment and intervention until BP is < 140/9100mHG in  hypertensive participants. < 130/8088mG in hypertensive participants with diabetes, heart failure or chronic kidney disease.;Long Term: Maintenance of blood pressure at goal levels.      Core Components/Risk Factors/Patient Goals Review:    Core Components/Risk Factors/Patient Goals at Discharge (Final Review):    ITP Comments:     ITP Comments    Row Name 11/17/16 1213 11/30/16 0817 12/22/16 1113 12/22/16 1115     ITP Comments Medical Evaluation Completed. Chart sent to Dr. MarEmily Filbertrector of [facility] signature and review. 30 day review completed. ITP sent to Dr. [place]ily [place]ber[place]ctor [place] unless changes are made by physician.   Called Hermen today to see if he was ok. He states that [place] is not coming back [place] appreciates all we did for him. He says he is working out on his own. I informed him that if he would like to come back he would need another referral. Discharge ITP sent and signed by Dr. MilSabra HeckDischarge Summary routed to PCP and cardiologist.       Comments: Discharge ITP

## 2018-06-22 DIAGNOSIS — Z8669 Personal history of other diseases of the nervous system and sense organs: Secondary | ICD-10-CM | POA: Insufficient documentation

## 2018-06-22 DIAGNOSIS — N289 Disorder of kidney and ureter, unspecified: Secondary | ICD-10-CM | POA: Insufficient documentation

## 2018-06-24 DIAGNOSIS — E782 Mixed hyperlipidemia: Secondary | ICD-10-CM | POA: Diagnosis not present

## 2018-06-24 DIAGNOSIS — K219 Gastro-esophageal reflux disease without esophagitis: Secondary | ICD-10-CM | POA: Diagnosis not present

## 2018-06-24 DIAGNOSIS — R519 Headache, unspecified: Secondary | ICD-10-CM | POA: Insufficient documentation

## 2018-06-24 DIAGNOSIS — R51 Headache: Secondary | ICD-10-CM | POA: Diagnosis not present

## 2018-06-24 DIAGNOSIS — I10 Essential (primary) hypertension: Secondary | ICD-10-CM | POA: Diagnosis not present

## 2018-06-24 DIAGNOSIS — C8590 Non-Hodgkin lymphoma, unspecified, unspecified site: Secondary | ICD-10-CM | POA: Insufficient documentation

## 2018-06-24 DIAGNOSIS — J410 Simple chronic bronchitis: Secondary | ICD-10-CM | POA: Diagnosis not present

## 2018-06-24 DIAGNOSIS — G8929 Other chronic pain: Secondary | ICD-10-CM | POA: Insufficient documentation

## 2018-07-05 ENCOUNTER — Encounter: Payer: Self-pay | Admitting: *Deleted

## 2018-07-05 ENCOUNTER — Other Ambulatory Visit: Payer: Self-pay | Admitting: *Deleted

## 2018-07-05 DIAGNOSIS — K219 Gastro-esophageal reflux disease without esophagitis: Secondary | ICD-10-CM | POA: Insufficient documentation

## 2018-07-05 DIAGNOSIS — E782 Mixed hyperlipidemia: Secondary | ICD-10-CM | POA: Insufficient documentation

## 2018-07-05 DIAGNOSIS — I2510 Atherosclerotic heart disease of native coronary artery without angina pectoris: Secondary | ICD-10-CM | POA: Insufficient documentation

## 2018-07-05 DIAGNOSIS — I10 Essential (primary) hypertension: Secondary | ICD-10-CM | POA: Insufficient documentation

## 2018-07-05 DIAGNOSIS — J449 Chronic obstructive pulmonary disease, unspecified: Secondary | ICD-10-CM | POA: Insufficient documentation

## 2018-07-05 DIAGNOSIS — R42 Dizziness and giddiness: Secondary | ICD-10-CM | POA: Insufficient documentation

## 2018-07-05 NOTE — Patient Outreach (Addendum)
HTA HRA Follow up call.  Spoke with Duane Klein this afternoon. He verified his name and DOB.   He shares that his community primary care provider is Dr. Juluis Pitch at the [facility]. He also goes to the [place] for most of his needs and medications. He updated his medication list with me.  His main health issue is COPD. I also see that he has a hx of CAD, CKD.  He is independent and resides with his wife. 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  He has not been in the hospital this year.  He accepted the invitation to engage with me on a monthly phone call over the next 3 months to learn more about his COPD  I will call him on May 26th.  Outpatient Encounter Medications as of 07/05/2018  Medication Sig  . albuterol (VENTOLIN HFA) 108 (90 Base) MCG/ACT inhaler Inhale into the lungs every 6 (six) hours as needed for wheezing or shortness of breath.  . diclofenac sodium (VOLTAREN) 1 % GEL Apply 2 g topically 4 (four) times daily.  Marland Kitchen diltiazem (CARDIZEM) 120 MG tablet Take 120 mg by mouth 4 (four) times daily.  Marland Kitchen lisinopril (ZESTRIL) 10 MG tablet Take 10 mg by mouth daily.  . methocarbamol (ROBAXIN) 750 MG tablet Take 750 mg by mouth 4 (four) times daily.  Marland Kitchen omeprazole (PRILOSEC) 20 MG capsule Take 20 mg by mouth daily.  . simvastatin (ZOCOR) 20 MG tablet Take 20 mg by mouth daily.  Marland Kitchen terazosin (HYTRIN) 10 MG capsule Take 10 mg by mouth at bedtime.  . Tiotropium Bromide-Olodaterol (STIOLTO RESPIMAT) 2.5-2.5 MCG/ACT AERS Inhale into the lungs once.  . topiramate (TOPAMAX) 100 MG tablet Take 100 mg by mouth 2 (two) times daily.  Marland Kitchen amitriptyline (ELAVIL) 100 MG tablet Take 100 mg by mouth at bedtime.  . ondansetron (ZOFRAN ODT) 4 MG disintegrating tablet Take 1 tablet (4 mg total) by mouth every 8 (eight) hours as needed for nausea or vomiting.   No facility-administered encounter medications on file as of 07/05/2018.    Fall Risk  07/05/2018 11/17/2016  Falls in the  past year? 0 No  Risk for fall due to : - Impaired vision  Follow up Falls evaluation completed -   Depression screen [facility] 2/9 07/05/2018 11/17/2016  Decreased Interest 0 0  Down, Depressed, Hopeless 0 0  PHQ - 2 Score 0 0  Altered sleeping - 0  Tired, decreased energy - 1  Change in appetite - 0  Feeling bad or failure about yourself  - 0  Trouble concentrating - 0  Moving slowly or fidgety/restless - 0  Suicidal thoughts - 0  PHQ-9 Score - 1  Difficult doing work/chores - Somewhat difficult   THN CM Care Plan Problem One     Most Recent Value  Care Plan Problem One  COPD with fair control  Role Documenting the Problem One  Care Management Coordinator  Care Plan for Problem One  Active  THN Long Term Goal   Pt will learn what his COPD Action Plan is and be able to tell me the steps in 3 months.  THN Long Term Goal Start Date  07/05/18  Interventions for Problem One Long Term Goal  Asked about his action plan, he does not have one, but does say he would take rescue inhaler and then call MD so he is one the right path.  THN CM Short Term Goal #1   Tell me what  are the sxs that you have when you're COPD is getting worse within the next 30 days..  Interventions for Short Term Goal #1  Discussed possible sxs and discussed why recognizing them early is so important.  THN CM Short Term Goal #2   Pt will be able to tell me what each inhaler does for him at the end of 60 days.  Interventions for Short Term Goal #2  Reconcilled his med list: Inhalers are albuterol and stioltorespirmat, nebs albuterol     Kayleen Memos C. Myrtie Neither, MSN, [facility] Gerontological Nurse Practitioner [facility] Care Management [phone]

## 2018-08-02 ENCOUNTER — Encounter: Payer: Self-pay | Admitting: *Deleted

## 2018-08-02 ENCOUNTER — Other Ambulatory Visit: Payer: Self-pay | Admitting: *Deleted

## 2018-08-02 NOTE — Patient Outreach (Signed)
Monthly telephone assessment for chronic disease management: COPD.  Mr. Tubbs states he is doing fairly well despite having to follow COVID19 restrictions. 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  I have encouraged him to delay this type of activities as he is a high risk pt if he could contract this disease. Being in a car with children from different families is a high risk activity and I advised against this. He could stay a safe distance at the truck stop, encouraged face mask, no hugging or physical contact and using sanitizer on hands frequently if he does touch someone or someones truck.  We discussed his self management of his COPD. He knows the COPD ACTION PLAN. He treats himself if he goes into the [place] and knows to call MD if stays in [place] without relief to avoid [place] and requiring a hospitalization.  We reviewed his meds and I found that he is taking 2 meds that I did not have in his Epic chart: mometasone furomate inhaler and chlorpheniramine for allergy sxs. I have added these to his medication list.  Outpatient Encounter Medications as of 08/02/2018  Medication Sig Note  . albuterol (VENTOLIN HFA) 108 (90 Base) MCG/ACT inhaler Inhale into the lungs every 6 (six) hours as needed for wheezing or shortness of breath.   . chlorpheniramine (CHLOR-TRIMETON) 4 MG tablet Take 4 mg by mouth 2 (two) times daily as needed for allergies. 08/02/2018: Takes as needed for allergy sxs. MD has told him he preferred that he not take this. Pt states it does relieve his sxs.  . mometasone (ASMANEX) 220 MCG/INH inhaler Inhale 2 puffs into the lungs daily.   . Tiotropium Bromide-Olodaterol (STIOLTO RESPIMAT) 2.5-2.5 MCG/ACT AERS Inhale into the lungs once.   Marland Kitchen amitriptyline (ELAVIL) 100 MG tablet Take 100 mg by mouth at bedtime.   . diclofenac sodium (VOLTAREN) 1 % GEL Apply 2 g topically  4 (four) times daily.   Marland Kitchen diltiazem (CARDIZEM) 120 MG tablet Take 120 mg by mouth 4 (four) times daily.   Marland Kitchen lisinopril (ZESTRIL) 10 MG tablet Take 10 mg by mouth daily.   . methocarbamol (ROBAXIN) 750 MG tablet Take 750 mg by mouth 4 (four) times daily.   Marland Kitchen omeprazole (PRILOSEC) 20 MG capsule Take 20 mg by mouth daily.   . ondansetron (ZOFRAN ODT) 4 MG disintegrating tablet Take 1 tablet (4 mg total) by mouth every 8 (eight) hours as needed for nausea or vomiting.   . simvastatin (ZOCOR) 20 MG tablet Take 20 mg by mouth daily.   Marland Kitchen terazosin (HYTRIN) 10 MG capsule Take 10 mg by mouth at bedtime.   . topiramate (TOPAMAX) 100 MG tablet Take 100 mg by mouth 2 (two) times daily.    No facility-administered encounter medications on file as of 08/02/2018.     THN CM Care Plan Problem One     Most Recent Value  Care Plan Problem One  COPD with fair control  Role Documenting the Problem One  Care Management Coordinator  Care Plan for Problem One  Active  THN CM Short Term Goal #1   Tell me what are the sxs that you have when you're COPD is getting worse within the next 30 days.Margie Billet CM Short Term Goal #1 Start Date  07/05/18  [facility] CM Short Term Goal #1 Met Date  08/02/18  Interventions for Short Term Goal #  1  Reinforced his current practices, following the COPD ACTION PLAN to avoid complications and hospitalizations.  THN CM Short Term Goal #2   Pt will be able to tell me what each inhaler does for him at the end of 60 days.  THN CM Short Term Goal #2 Start Date  07/05/18  Interventions for Short Term Goal #2  Added 2 meds that were not previously on pt medication list. Will advise MD.     Dr. Lovie Macadamia: Please see that I have added 2 meds that pt is using that were not in the Epic record.  I have also completed the Social Determinants of Health.  Will call him again in one month.  Eulah Pont. Myrtie Neither, MSN, [facility] Gerontological Nurse Practitioner [facility] Care Management [phone]

## 2018-09-05 ENCOUNTER — Other Ambulatory Visit: Payer: Self-pay

## 2018-09-05 ENCOUNTER — Encounter: Payer: Self-pay | Admitting: *Deleted

## 2018-09-05 ENCOUNTER — Other Ambulatory Visit: Payer: Self-pay | Admitting: *Deleted

## 2018-09-05 NOTE — Patient Outreach (Signed)
Final call and case closure for HTA pt that is transitioning to [facility] care management.  Duane Klein reports he is doing well. He is following safe distancing and hand washing. He does not wear a mask at all times because it does make him hot and makes it more difficult to breath easily. He will wear a mask to go into a store for a quick errand or in places that state you have to wear a mask.  He is getting ready to start the Silver Sneakers program. 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  He reports he has all his medications and is taking them. He understands the COPD Action Plan.  He will see his primary MD in July.  Advised that this will be my last call. Pt reports the [facility] nurse will see him this week.  Eulah Pont. Myrtie Neither, MSN, [facility] Gerontological Nurse Practitioner [facility] Care Management [phone]

## 2018-11-03 DIAGNOSIS — J410 Simple chronic bronchitis: Secondary | ICD-10-CM | POA: Diagnosis not present

## 2018-11-03 DIAGNOSIS — E782 Mixed hyperlipidemia: Secondary | ICD-10-CM | POA: Diagnosis not present

## 2018-11-03 DIAGNOSIS — K219 Gastro-esophageal reflux disease without esophagitis: Secondary | ICD-10-CM | POA: Diagnosis not present

## 2018-11-03 DIAGNOSIS — Z0000 Encounter for general adult medical examination without abnormal findings: Secondary | ICD-10-CM | POA: Diagnosis not present

## 2018-11-03 DIAGNOSIS — C8593 Non-Hodgkin lymphoma, unspecified, intra-abdominal lymph nodes: Secondary | ICD-10-CM | POA: Diagnosis not present

## 2018-11-03 DIAGNOSIS — I10 Essential (primary) hypertension: Secondary | ICD-10-CM | POA: Diagnosis not present

## 2018-11-03 DIAGNOSIS — R51 Headache: Secondary | ICD-10-CM | POA: Diagnosis not present

## 2018-11-03 DIAGNOSIS — I2510 Atherosclerotic heart disease of native coronary artery without angina pectoris: Secondary | ICD-10-CM | POA: Diagnosis not present

## 2019-02-22 ENCOUNTER — Emergency Department: Payer: No Typology Code available for payment source

## 2019-02-22 ENCOUNTER — Other Ambulatory Visit: Payer: Self-pay

## 2019-02-22 ENCOUNTER — Emergency Department
Admission: EM | Admit: 2019-02-22 | Discharge: 2019-02-22 | Disposition: A | Discharge status: Home / Self Care | Payer: No Typology Code available for payment source | Attending: Student | Admitting: Student

## 2019-02-22 ENCOUNTER — Encounter: Payer: Self-pay | Admitting: Intensive Care

## 2019-02-22 DIAGNOSIS — C8590 Non-Hodgkin lymphoma, unspecified, unspecified site: Secondary | ICD-10-CM | POA: Diagnosis not present

## 2019-02-22 DIAGNOSIS — I10 Essential (primary) hypertension: Secondary | ICD-10-CM | POA: Insufficient documentation

## 2019-02-22 DIAGNOSIS — R509 Fever, unspecified: Secondary | ICD-10-CM | POA: Diagnosis not present

## 2019-02-22 DIAGNOSIS — J449 Chronic obstructive pulmonary disease, unspecified: Secondary | ICD-10-CM | POA: Diagnosis not present

## 2019-02-22 DIAGNOSIS — U071 COVID-19: Secondary | ICD-10-CM | POA: Diagnosis not present

## 2019-02-22 DIAGNOSIS — I2510 Atherosclerotic heart disease of native coronary artery without angina pectoris: Secondary | ICD-10-CM | POA: Diagnosis not present

## 2019-02-22 DIAGNOSIS — R059 Cough, unspecified: Secondary | ICD-10-CM

## 2019-02-22 DIAGNOSIS — Z85528 Personal history of other malignant neoplasm of kidney: Secondary | ICD-10-CM | POA: Insufficient documentation

## 2019-02-22 DIAGNOSIS — M791 Myalgia, unspecified site: Secondary | ICD-10-CM | POA: Diagnosis not present

## 2019-02-22 DIAGNOSIS — R05 Cough: Secondary | ICD-10-CM

## 2019-02-22 DIAGNOSIS — R52 Pain, unspecified: Secondary | ICD-10-CM

## 2019-02-22 DIAGNOSIS — Z79899 Other long term (current) drug therapy: Secondary | ICD-10-CM | POA: Insufficient documentation

## 2019-02-22 DIAGNOSIS — Z87891 Personal history of nicotine dependence: Secondary | ICD-10-CM | POA: Diagnosis not present

## 2019-02-22 LAB — CBC WITH DIFFERENTIAL/PLATELET
Abs Immature Granulocytes: 0.01 10*3/uL (ref 0.00–0.07)
Basophils Absolute: 0 10*3/uL (ref 0.0–0.1)
Basophils Relative: 0 %
Eosinophils Absolute: 0 10*3/uL (ref 0.0–0.5)
Eosinophils Relative: 0 %
HCT: 42.8 % (ref 39.0–52.0)
Hemoglobin: 14.2 g/dL (ref 13.0–17.0)
Immature Granulocytes: 0 %
Lymphocytes Relative: 10 %
Lymphs Abs: 0.5 10*3/uL — ABNORMAL LOW (ref 0.7–4.0)
MCH: 32.1 pg (ref 26.0–34.0)
MCHC: 33.2 g/dL (ref 30.0–36.0)
MCV: 96.8 fL (ref 80.0–100.0)
Monocytes Absolute: 0.4 10*3/uL (ref 0.1–1.0)
Monocytes Relative: 8 %
Neutro Abs: 4.2 10*3/uL (ref 1.7–7.7)
Neutrophils Relative %: 82 %
Platelets: 168 10*3/uL (ref 150–400)
RBC: 4.42 MIL/uL (ref 4.22–5.81)
RDW: 13.6 % (ref 11.5–15.5)
WBC: 5.2 10*3/uL (ref 4.0–10.5)
nRBC: 0 % (ref 0.0–0.2)

## 2019-02-22 LAB — LACTIC ACID, PLASMA: Lactic Acid, Venous: 1.1 mmol/L (ref 0.5–1.9)

## 2019-02-22 LAB — COMPREHENSIVE METABOLIC PANEL
ALT: 18 U/L (ref 0–44)
AST: 24 U/L (ref 15–41)
Albumin: 3.6 g/dL (ref 3.5–5.0)
Alkaline Phosphatase: 58 U/L (ref 38–126)
Anion gap: 8 (ref 5–15)
BUN: 16 mg/dL (ref 8–23)
CO2: 19 mmol/L — ABNORMAL LOW (ref 22–32)
Calcium: 8.3 mg/dL — ABNORMAL LOW (ref 8.9–10.3)
Chloride: 113 mmol/L — ABNORMAL HIGH (ref 98–111)
Creatinine, Ser: 1.37 mg/dL — ABNORMAL HIGH (ref 0.61–1.24)
GFR calc Af Amer: >60 mL/min (ref >60)
GFR calc non Af Amer: 53 mL/min — ABNORMAL LOW (ref >60)
Glucose, Bld: 97 mg/dL (ref 70–99)
Potassium: 4.1 mmol/L (ref 3.5–5.1)
Sodium: 140 mmol/L (ref 135–145)
Total Bilirubin: 0.5 mg/dL (ref 0.3–1.2)
Total Protein: 6.4 g/dL — ABNORMAL LOW (ref 6.5–8.1)

## 2019-02-22 LAB — PROCALCITONIN: Procalcitonin: 0.14 ng/mL

## 2019-02-22 MED ORDER — ACETAMINOPHEN 325 MG PO TABS
650.0000 mg | ORAL_TABLET | Freq: Once | ORAL | Status: AC | PRN
  Administered 2019-02-22: 650 mg via ORAL
  Filled 2019-02-22: qty 2

## 2019-02-22 MED ORDER — AZITHROMYCIN 250 MG PO TABS: 250.0000 mg | ORAL_TABLET | Freq: Every day | ORAL | 0 refills | Status: AC

## 2019-02-22 MED ORDER — SODIUM CHLORIDE 0.9 % IV BOLUS
500.0000 mL | Freq: Once | INTRAVENOUS | Status: AC
  Administered 2019-02-22: 500 mL via INTRAVENOUS

## 2019-02-22 MED ORDER — PREDNISONE 20 MG PO TABS
40.0000 mg | ORAL_TABLET | Freq: Once | ORAL | Status: AC
  Administered 2019-02-22: 40 mg via ORAL
  Filled 2019-02-22: qty 2

## 2019-02-22 MED ORDER — PREDNISONE 20 MG PO TABS: 40.0000 mg | ORAL_TABLET | Freq: Every day | ORAL | 0 refills | Status: DC

## 2019-02-22 MED ORDER — AZITHROMYCIN 500 MG PO TABS
500.0000 mg | ORAL_TABLET | Freq: Once | ORAL | Status: AC
  Administered 2019-02-22: 500 mg via ORAL
  Filled 2019-02-22: qty 1

## 2019-02-22 NOTE — ED Notes (Signed)
Ambulated pt in the room, pt spo2 remained 92% and above and pt tolerated this well.  EDP was notified

## 2019-02-22 NOTE — ED Notes (Signed)
Patient resting quietly in room comfortably and expresses no needs at this time.  Will continue to monitor.

## 2019-02-22 NOTE — ED Notes (Signed)
Covid Ag POSITIVE. EDP notified

## 2019-02-22 NOTE — Discharge Instructions (Signed)
You were positive for COVID today.   You must quarantine/social distance for at least 10 days since your symptoms first appeared AND 24 hours fever free without the use of fever-reducing medication (such as ibuprofen or Tylenol).  Please continue to take any regular, prescribed medications.   New medications we have prescribed:  - prednisone - a steroid medication - azithromycin - an antibiotic  Please return to the ER for any new or worsening symptoms, including vomiting, diarrhea, worsening trouble breathing, dehydration, or any other signs/symptoms concerning to you.

## 2019-02-22 NOTE — ED Provider Notes (Signed)
[facility] Emergency Department Provider Note  ____________________________________________   First MD Initiated Contact with Patient 02/22/19 1636     (approximate)  I have reviewed the triage vital signs and the nursing notes.  History  Chief Complaint Fever, Generalized Body Aches, and Cough    HPI Duane Klein is a 68 y.o. male with history of COPD, CAD/HTN, HLD, non-Hodgkin's lymphoma who presents emergency department for fevers, generalized body aches, cough.  Patient reports increasing cough x3 weeks, however over the last several days it has become more severe as well as productive of yellowish-brown sputum.  He developed fevers 2 days ago.  Associated with generalized body aches and decreased appetite.  No vomiting or diarrhea.  He denies any sick contacts or known COVID exposure.  He reports some sinus discomfort, but no rhinorrhea or purulent drainage.   Past Medical Hx Past Medical History:  Diagnosis Date  . Atrial fibrillation ([place])   . Cancer of kidney ([place])   . Emphysema lung ([place])   . Hypertension     Problem List Patient Active Problem List   Diagnosis Date Noted  . CAD (coronary artery disease) 07/05/2018  . 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 COPD (chronic obstructive pulmonary disease) ([place]) 07/05/2018  . Dizziness 07/05/2018  . GERD (gastroesophageal reflux disease) 07/05/2018  . HTN (hypertension) 07/05/2018  . Chronic headaches 06/24/2018  . Non-Hodgkin's lymphoma ([place]) 06/24/2018  . History of obstructive sleep apnea 06/22/2018  . Kidney disease 06/22/2018    Past Surgical Hx Past Surgical History:  Procedure Laterality Date  . APPENDECTOMY    . ROTATOR CUFF REPAIR     right  . TONSILLECTOMY      Medications Prior to Admission medications   Medication Sig Start Date End Date Taking? Authorizing Provider  albuterol (VENTOLIN HFA) 108 (90 Base) MCG/ACT inhaler Inhale into the lungs every 6 (six) hours as  needed for wheezing or shortness of breath.    [provider]  amitriptyline (ELAVIL) 100 MG tablet Take 100 mg by mouth at bedtime.    [provider]  chlorpheniramine (CHLOR-TRIMETON) 4 MG tablet Take 4 mg by mouth 2 (two) times daily as needed for allergies.    [provider]  diclofenac sodium (VOLTAREN) 1 % GEL Apply 2 g topically 4 (four) times daily.    [provider]  diltiazem (CARDIZEM) 120 MG tablet Take 120 mg by mouth 4 (four) times daily.    [provider]  lisinopril (ZESTRIL) 10 MG tablet Take 10 mg by mouth daily.    [provider]  methocarbamol (ROBAXIN) 750 MG tablet Take 750 mg by mouth 4 (four) times daily.    [provider]  mometasone [facility]) 220 MCG/INH inhaler Inhale 2 puffs into the lungs daily.    [provider]  omeprazole (PRILOSEC) 20 MG capsule Take 20 mg by mouth daily.    [provider]  ondansetron (ZOFRAN ODT) 4 MG disintegrating tablet Take 1 tablet (4 mg total) by mouth every 8 (eight) hours as needed for nausea or vomiting. 03/08/15   Joanne Gavel, MD  simvastatin (ZOCOR) 20 MG tablet Take 20 mg by mouth daily.    [provider]  terazosin (HYTRIN) 10 MG capsule Take 10 mg by mouth at bedtime.    [provider]  Tiotropium Bromide-Olodaterol (STIOLTO RESPIMAT) 2.5-2.5 MCG/ACT AERS Inhale into the lungs once.    [provider]  topiramate (TOPAMAX) 100 MG tablet Take 100 mg  by mouth 2 (two) times daily.    [provider]    Allergies Gabapentin and Tramadol  Family Hx History reviewed. No pertinent family history.  Social Hx Social History   Tobacco Use  . Smoking status: Former Smoker    Packs/day: 3.00    Years: 35.00    Pack years: 105.00    Types: Cigarettes    Quit date: 04/09/2002    Years since quitting: 16.8  . Smokeless tobacco: Former Systems developer    Types: Snuff, Chew    Quit date: 03/09/1997  . Tobacco comment:  patient has not smoked since 2004  Substance Use Topics  . Alcohol use: Yes    Comment: occ  . Drug use: No     Review of Systems  Constitutional: + for fever, chills, body aches. Eyes: Negative for visual changes. ENT: Negative for sore throat. Cardiovascular: Negative for chest pain. Respiratory: + cough Gastrointestinal: Negative for nausea, vomiting.  Genitourinary: Negative for dysuria. Musculoskeletal: Negative for leg swelling. Skin: Negative for rash. Neurological: Negative for for headaches.   Physical Exam  Vital Signs: ED Triage Vitals [02/22/19 1600]  Enc Vitals Group     BP 133/85     Pulse Rate (!) 103     Resp 16     Temp (!) 102.2 F (39 C)     Temp Source Oral     SpO2 93 %     Weight 184 lb (83.5 kg)     Height 6\' 2"  (1.88 m)     Head Circumference      Peak Flow      Pain Score 8     Pain Loc      Pain Edu?      Excl. in [place]?     Constitutional: Alert and oriented.  Head: Normocephalic. Atraumatic. Eyes: Conjunctivae clear. Sclera anicteric. Nose: No congestion. No rhinorrhea or purulent drainage.  Frontal, maxillary, ethmoid sinuses non-tender. Mouth/Throat: Wearing mask.  Neck: No stridor.   Cardiovascular: Regular rhythm, HR low 100s. Extremities well perfused. Respiratory: Normal respiratory effort.  Faint expiratory wheezing at the right lung base.  No accessory muscle use.  Speaking in full sentences.  Oxygen remains 92% and higher. Gastrointestinal: Soft. Non-tender. Non-distended.  Musculoskeletal: No lower extremity edema. No deformities. Neurologic:  Normal speech and language. No gross focal neurologic deficits are appreciated.  Skin: Skin is warm, dry and intact. No rash noted. Psychiatric: Mood and affect are appropriate for situation.  EKG  N/A    Radiology  CXR: IMPRESSION: Stable mild chronic bronchitic changes. No acute abnormality.     Procedures  Procedure(s) performed (including critical  care):  Procedures   Initial Impression / Assessment and Plan / ED Course  68 y.o. male who presents to the ED for fevers, cough, body aches.  Ddx: COVID, other viral process, COPD exacerbation, other pulmonary infection  On arrival, he is febrile and tachycardic.  Will obtain labs, viral swabs, reassess.  COVID testing is positive, which aligns with his symptomatology.  He remains stable from a hemodynamic and respiratory perspective.  On ambulatory trial, his oxygen remains 92% and above, with no increased work of breathing.  Labs otherwise without actionable derangements, creatinine slightly elevated but at his baseline.  Given his underlying COPD, with increasingly productive cough in the setting of known viral process, will elect to treat with course of steroids and azithromycin.  Patient is agreeable to this.  Discussed the need for quarantining/social distancing, and notifying any close  contacts, exposures.  Patient voices understanding of this.  Patient is otherwise stable for discharge, discussed strict return precautions.  He is comfortable with the plan and discharge.   Final Clinical Impression(s) / ED Diagnosis  Final diagnoses:  Fever in adult  Body aches  Cough  COVID-19       Note:  This document was prepared using Dragon voice recognition software and may include unintentional dictation errors.   Lilia Pro., MD 02/22/19 [phone]

## 2019-02-22 NOTE — ED Triage Notes (Signed)
Patient c/o fever, body aches, and cough. Last took tylenol this AM

## 2019-02-27 ENCOUNTER — Emergency Department
Admission: EM | Admit: 2019-02-27 | Discharge: 2019-02-27 | Disposition: A | Discharge status: Home / Self Care | Payer: No Typology Code available for payment source | Attending: Emergency Medicine | Admitting: Emergency Medicine

## 2019-02-27 ENCOUNTER — Emergency Department: Payer: No Typology Code available for payment source

## 2019-02-27 ENCOUNTER — Other Ambulatory Visit: Payer: Self-pay

## 2019-02-27 DIAGNOSIS — U071 COVID-19: Secondary | ICD-10-CM

## 2019-02-27 DIAGNOSIS — Z87891 Personal history of nicotine dependence: Secondary | ICD-10-CM | POA: Diagnosis not present

## 2019-02-27 DIAGNOSIS — J449 Chronic obstructive pulmonary disease, unspecified: Secondary | ICD-10-CM | POA: Diagnosis not present

## 2019-02-27 DIAGNOSIS — Z79899 Other long term (current) drug therapy: Secondary | ICD-10-CM | POA: Insufficient documentation

## 2019-02-27 DIAGNOSIS — I2510 Atherosclerotic heart disease of native coronary artery without angina pectoris: Secondary | ICD-10-CM | POA: Diagnosis not present

## 2019-02-27 DIAGNOSIS — Z791 Long term (current) use of non-steroidal anti-inflammatories (NSAID): Secondary | ICD-10-CM | POA: Diagnosis not present

## 2019-02-27 DIAGNOSIS — R0602 Shortness of breath: Secondary | ICD-10-CM | POA: Diagnosis not present

## 2019-02-27 DIAGNOSIS — I129 Hypertensive chronic kidney disease with stage 1 through stage 4 chronic kidney disease, or unspecified chronic kidney disease: Secondary | ICD-10-CM | POA: Diagnosis not present

## 2019-02-27 DIAGNOSIS — N189 Chronic kidney disease, unspecified: Secondary | ICD-10-CM | POA: Diagnosis not present

## 2019-02-27 LAB — CBC WITH DIFFERENTIAL/PLATELET
Abs Immature Granulocytes: 0.02 10*3/uL (ref 0.00–0.07)
Basophils Absolute: 0 10*3/uL (ref 0.0–0.1)
Basophils Relative: 0 %
Eosinophils Absolute: 0 10*3/uL (ref 0.0–0.5)
Eosinophils Relative: 0 %
HCT: 43.1 % (ref 39.0–52.0)
Hemoglobin: 13.8 g/dL (ref 13.0–17.0)
Immature Granulocytes: 0 %
Lymphocytes Relative: 16 %
Lymphs Abs: 1.1 10*3/uL (ref 0.7–4.0)
MCH: 31.8 pg (ref 26.0–34.0)
MCHC: 32 g/dL (ref 30.0–36.0)
MCV: 99.3 fL (ref 80.0–100.0)
Monocytes Absolute: 0.3 10*3/uL (ref 0.1–1.0)
Monocytes Relative: 4 %
Neutro Abs: 5.6 10*3/uL (ref 1.7–7.7)
Neutrophils Relative %: 80 %
Platelets: 182 10*3/uL (ref 150–400)
RBC: 4.34 MIL/uL (ref 4.22–5.81)
RDW: 13.9 % (ref 11.5–15.5)
WBC: 7 10*3/uL (ref 4.0–10.5)
nRBC: 0 % (ref 0.0–0.2)

## 2019-02-27 LAB — CULTURE, BLOOD (ROUTINE X 2)
Culture: NO GROWTH
Culture: NO GROWTH
Special Requests: ADEQUATE
Special Requests: ADEQUATE

## 2019-02-27 LAB — COMPREHENSIVE METABOLIC PANEL
ALT: 28 U/L (ref 0–44)
AST: 32 U/L (ref 15–41)
Albumin: 3.2 g/dL — ABNORMAL LOW (ref 3.5–5.0)
Alkaline Phosphatase: 50 U/L (ref 38–126)
Anion gap: 8 (ref 5–15)
BUN: 23 mg/dL (ref 8–23)
CO2: 21 mmol/L — ABNORMAL LOW (ref 22–32)
Calcium: 8.1 mg/dL — ABNORMAL LOW (ref 8.9–10.3)
Chloride: 112 mmol/L — ABNORMAL HIGH (ref 98–111)
Creatinine, Ser: 1.44 mg/dL — ABNORMAL HIGH (ref 0.61–1.24)
GFR calc Af Amer: 58 mL/min — ABNORMAL LOW (ref >60)
GFR calc non Af Amer: 50 mL/min — ABNORMAL LOW (ref >60)
Glucose, Bld: 96 mg/dL (ref 70–99)
Potassium: 3.8 mmol/L (ref 3.5–5.1)
Sodium: 141 mmol/L (ref 135–145)
Total Bilirubin: 0.6 mg/dL (ref 0.3–1.2)
Total Protein: 6.4 g/dL — ABNORMAL LOW (ref 6.5–8.1)

## 2019-02-27 LAB — LACTIC ACID, PLASMA: Lactic Acid, Venous: 1.2 mmol/L (ref 0.5–1.9)

## 2019-02-27 MED ORDER — PREDNISONE 10 MG PO TABS: 10.0000 mg | ORAL_TABLET | Freq: Every day | ORAL | 0 refills | Status: DC

## 2019-02-27 MED ORDER — ACETAMINOPHEN 325 MG PO TABS
650.0000 mg | ORAL_TABLET | Freq: Once | ORAL | Status: AC | PRN
  Administered 2019-02-27: 650 mg via ORAL
  Filled 2019-02-27: qty 2

## 2019-02-27 MED ORDER — ALBUTEROL SULFATE (2.5 MG/3ML) 0.083% IN NEBU: 2.5000 mg | INHALATION_SOLUTION | Freq: Four times a day (QID) | RESPIRATORY_TRACT | 1 refills | Status: AC | PRN

## 2019-02-27 NOTE — ED Provider Notes (Signed)
[facility] Emergency Department Provider Note  Time seen: 5:34 PM  I have reviewed the triage vital signs and the nursing notes.   HISTORY  Chief Complaint COVID, Fever, and Shortness of Breath   HPI Duane Klein is a 68 y.o. male with a past medical history of CKD, hypertension, COPD, presents to the emergency department for continued cough shortness of breath and fever.  According to the patient he is on day 7 of symptoms after testing positive for COVID-19.  Patient states he has been controlling the fever with Tylenol however today the fever returned and he began feeling somewhat more short of breath so he came to the emergency department for evaluation.  Patient has a history of COPD and states he always feels somewhat short of breath, states he ran out of his nebulizer solution as well which is another reason he came to the ER today.  Patient states he last took Tylenol early this morning, temperature upon arrival is 1-1.4 with a 94% O2 saturation on room air.   Past Medical History:  Diagnosis Date  . Atrial fibrillation ([place])   . Cancer of kidney ([place])   . Emphysema lung ([place])   . Hypertension     Patient Active Problem List   Diagnosis Date Noted  . CAD (coronary artery disease) 07/05/2018  . 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 COPD (chronic obstructive pulmonary disease) ([place]) 07/05/2018  . Dizziness 07/05/2018  . GERD (gastroesophageal reflux disease) 07/05/2018  . HTN (hypertension) 07/05/2018  . Chronic headaches 06/24/2018  . Non-Hodgkin's lymphoma ([place]) 06/24/2018  . History of obstructive sleep apnea 06/22/2018  . Kidney disease 06/22/2018    Past Surgical History:  Procedure Laterality Date  . APPENDECTOMY    . ROTATOR CUFF REPAIR     right  . TONSILLECTOMY      Prior to Admission medications   Medication Sig Start Date End Date Taking? Authorizing Provider  albuterol (VENTOLIN HFA) 108 (90 Base) MCG/ACT inhaler Inhale into  the lungs every 6 (six) hours as needed for wheezing or shortness of breath.    [provider]  amitriptyline (ELAVIL) 100 MG tablet Take 100 mg by mouth at bedtime.    [provider]  azithromycin (ZITHROMAX Z-PAK) 250 MG tablet Take 1 tablet (250 mg total) by mouth daily for 4 days. 02/23/19 02/27/19  Lilia Pro., MD  chlorpheniramine (CHLOR-TRIMETON) 4 MG tablet Take 4 mg by mouth 2 (two) times daily as needed for allergies.    [provider]  diclofenac sodium (VOLTAREN) 1 % GEL Apply 2 g topically 4 (four) times daily.    [provider]  diltiazem (CARDIZEM) 120 MG tablet Take 120 mg by mouth 4 (four) times daily.    [provider]  lisinopril (ZESTRIL) 10 MG tablet Take 10 mg by mouth daily.    [provider]  methocarbamol (ROBAXIN) 750 MG tablet Take 750 mg by mouth 4 (four) times daily.    [provider]  mometasone [facility]) 220 MCG/INH inhaler Inhale 2 puffs into the lungs daily.    [provider]  omeprazole (PRILOSEC) 20 MG capsule Take 20 mg by mouth daily.    [provider]  ondansetron (ZOFRAN ODT) 4 MG disintegrating tablet Take 1 tablet (4 mg total) by mouth every 8 (eight) hours as needed for nausea or vomiting. 03/08/15   Joanne Gavel, MD  predniSONE (DELTASONE) 20 MG tablet Take 2 tablets (40 mg total) by mouth daily  with breakfast for 4 days. 02/23/19 02/27/19  Lilia Pro., MD  simvastatin (ZOCOR) 20 MG tablet Take 20 mg by mouth daily.    [provider]  terazosin (HYTRIN) 10 MG capsule Take 10 mg by mouth at bedtime.    [provider]  Tiotropium Bromide-Olodaterol (STIOLTO RESPIMAT) 2.5-2.5 MCG/ACT AERS Inhale into the lungs once.    [provider]  topiramate (TOPAMAX) 100 MG tablet Take 100 mg by mouth 2 (two) times daily.    [provider]    Allergies  Allergen Reactions  . Gabapentin     Makes patient violent  . Tramadol  Itching    No family history on file.  Social History Social History   Tobacco Use  . Smoking status: Former Smoker    Packs/day: 3.00    Years: 35.00    Pack years: 105.00    Types: Cigarettes    Quit date: 04/09/2002    Years since quitting: 16.8  . Smokeless tobacco: Former Systems developer    Types: Snuff, Chew    Quit date: 03/09/1997  . Tobacco comment: patient has not smoked since 2004  Substance Use Topics  . Alcohol use: Yes    Comment: occ  . Drug use: No    Review of Systems Constitutional: Intermittent fever x1 week ENT: Mild congestion Cardiovascular: Negative for chest pain. Respiratory: Positive for shortness of breath.  Positive for cough. Gastrointestinal: Negative for abdominal pain, vomiting Genitourinary: Negative for urinary compaints Musculoskeletal: Negative for musculoskeletal complaints Neurological: Negative for headache All other ROS negative  ____________________________________________   PHYSICAL EXAM:  VITAL SIGNS: ED Triage Vitals [02/27/19 1608]  Enc Vitals Group     BP (!) 146/76     Pulse Rate (!) 57     Resp (!) 22     Temp (!) 101.4 F (38.6 C)     Temp src      SpO2 94 %     Weight 182 lb 15.7 oz (83 kg)     Height 6\' 2"  (1.88 m)     Head Circumference      Peak Flow      Pain Score 0     Pain Loc      Pain Edu?      Excl. in [place]?    Constitutional: Alert and oriented. Well appearing and in no distress. Eyes: Normal exam ENT      Head: Normocephalic and atraumatic.      Mouth/Throat: Mucous membranes are moist. Cardiovascular: Normal rate, regular rhythm.  Respiratory: Normal respiratory effort without tachypnea nor retractions. Breath sounds are clear  Gastrointestinal: Soft and nontender. No distention.  Musculoskeletal: Nontender with normal range of motion in all extremities.  Neurologic:  Normal speech and language. No gross focal neurologic deficits  Skin:  Skin is warm, dry and intact.  Psychiatric: Mood and affect are  normal.   ____________________________________________    EKG  EKG viewed and interpreted by myself shows a sinus rhythm 86 bpm with a narrow QRS, normal axis, normal intervals, no concerning ST changes.  ____________________________________________    RADIOLOGY  Chest x-ray shows multifocal pneumonia.  ____________________________________________   INITIAL IMPRESSION / ASSESSMENT AND PLAN / ED COURSE  Pertinent labs & imaging results that were available during my care of the patient were reviewed by me and considered in my medical decision making (see chart for details).   Patient presents to the emergency department with COVID-19.  Patient's x-ray consistent multifocal pneumonia, lab work  is largely nonrevealing patient is febrile.  We will dose Tylenol we will continue to reassess the patient in the emergency department.  Patient states he is out of his nebulizer solution, we will write a new prescription for the patient.  Given his multifocal pneumonia on chest x-ray and his history of COPD I believe it would be reasonable to cover with both steroids as well as Zithromax to cover for possible superinfection.  Given the patient's comorbidities and severity of disease I also referred the patient to the [facility] infusion center for hopefully arrangement of infusion of antibodies.  Patient agreeable to plan of care.  Patient continues to have a room air saturation in the mid 90s and I believe the patient is appropriate for discharge home at this time.  Patient just finished a course of Zithromax.  We will place on a prednisone taper, albuterol solution and have the patient follow-up with the infusion clinic.  I discussed my typical Covid return precautions.  Dolores LAKER JOSEPHSEN was evaluated in Emergency Department on 02/27/2019 for the symptoms described in the history of present illness. He was evaluated in the context of the global COVID-19 pandemic, which necessitated consideration that  the patient might be at risk for infection with the SARS-CoV-2 virus that causes COVID-19. Institutional protocols and algorithms that pertain to the evaluation of patients at risk for COVID-19 are in a state of rapid change based on information released by regulatory bodies including the CDC and federal and state organizations. These policies and algorithms were followed during the patient's care in the ED.  ____________________________________________   FINAL CLINICAL IMPRESSION(S) / ED DIAGNOSES  COVID-19    Harvest Dark, MD 02/27/19 1740

## 2019-02-27 NOTE — ED Notes (Signed)
Pt signed for discharge - not for transfer.

## 2019-02-27 NOTE — ED Triage Notes (Signed)
Pt was dx with COVID on Wednesday, reports fever returned today. Pt reports SOB and white productive thick sputum with cough. Pt last took Tylenol early this AM. Exertional SOB, shallow RR. Pt able to hold conversation with this RN without RR distress.

## 2019-02-27 NOTE — Discharge Instructions (Signed)
You have been referred to the [facility] infusion center.  They should be calling you tomorrow to arrange your infusion appointment.  If you do not hear from them by 12 PM tomorrow please call them at this number: [phone]

## 2019-02-28 ENCOUNTER — Telehealth: Payer: Self-pay | Admitting: Critical Care Medicine

## 2019-02-28 ENCOUNTER — Other Ambulatory Visit: Payer: Self-pay | Admitting: Critical Care Medicine

## 2019-02-28 DIAGNOSIS — I2510 Atherosclerotic heart disease of native coronary artery without angina pectoris: Secondary | ICD-10-CM

## 2019-02-28 DIAGNOSIS — U071 COVID-19: Secondary | ICD-10-CM

## 2019-02-28 DIAGNOSIS — J432 Centrilobular emphysema: Secondary | ICD-10-CM

## 2019-02-28 DIAGNOSIS — I10 Essential (primary) hypertension: Secondary | ICD-10-CM

## 2019-02-28 NOTE — Telephone Encounter (Signed)
I connected this patient who is Covid positive since an original test December 16.  The patient has been ill since that time with increased cough and shortness of breath.  He went back to the emergency room on December 21 and now has evidence of pneumonia.  He has been referred for monoclonal antibody and is a candidate based on his age and coexisting conditions.  See orders only entry  Called to discuss with patient about Covid symptoms and the use of bamlanivimab, a monoclonal antibody infusion for those with mild to moderate Covid symptoms and at a high risk of hospitalization.  Pt is qualified for this infusion at the [facility] infusion center due to Age > 65, Hypertension, immuno-compromised and COPD

## 2019-02-28 NOTE — Progress Notes (Signed)
  I connected by phone with Duane Klein on 02/28/2019 at 10:47 AM to discuss the potential use of an new treatment for mild to moderate COVID-19 viral infection in non-hospitalized patients.  This patient is a 68 y.o. male that meets the FDA criteria for Emergency Use Authorization of bamlanivimab or casirivimab\imdevimab.  Has a (+) direct SARS-CoV-2 viral test result  Has mild or moderate COVID-19   Is ? 66 years of age and weighs ? 40 kg  Is NOT hospitalized due to COVID-19  Is NOT requiring oxygen therapy or requiring an increase in baseline oxygen flow rate due to COVID-19  Is within 10 days of symptom onset  Has at least one of the high risk factor(s) for progression to severe COVID-19 and/or hospitalization as defined in EUA.  Specific high risk criteria : >/= 66 yo age > 11 COPD, HTN CAD   I have spoken and communicated the following to the patient or parent/caregiver:  1. FDA has authorized the emergency use of bamlanivimab and casirivimab\imdevimab for the treatment of mild to moderate COVID-19 in adults and pediatric patients with positive results of direct SARS-CoV-2 viral testing who are 40 years of age and older weighing at least 40 kg, and who are at high risk for progressing to severe COVID-19 and/or hospitalization.  2. The significant known and potential risks and benefits of bamlanivimab and casirivimab\imdevimab, and the extent to which such potential risks and benefits are unknown.  3. Information on available alternative treatments and the risks and benefits of those alternatives, including clinical trials.  4. Patients treated with bamlanivimab and casirivimab\imdevimab should continue to self-isolate and use infection control measures (e.g., wear mask, isolate, social distance, avoid sharing personal items, clean and disinfect "high touch" surfaces, and frequent handwashing) according to CDC guidelines.   5. The patient or parent/caregiver has the option to  accept or refuse bamlanivimab or casirivimab\imdevimab .  After reviewing this information with the patient, The patient agreed to proceed with receiving the bamlanimivab infusion and will be provided a copy of the Fact sheet prior to receiving the infusion.Asencion Noble 02/28/2019 10:47 AM

## 2019-03-01 ENCOUNTER — Ambulatory Visit (HOSPITAL_COMMUNITY)
Admission: RE | Admit: 2019-03-01 | Discharge: 2019-03-01 | Disposition: A | Discharge status: Home / Self Care | Payer: Medicare Other | Source: Ambulatory Visit | Attending: Pulmonary Disease | Admitting: Pulmonary Disease

## 2019-03-01 DIAGNOSIS — I10 Essential (primary) hypertension: Secondary | ICD-10-CM

## 2019-03-01 DIAGNOSIS — J432 Centrilobular emphysema: Secondary | ICD-10-CM

## 2019-03-01 DIAGNOSIS — I2510 Atherosclerotic heart disease of native coronary artery without angina pectoris: Secondary | ICD-10-CM

## 2019-03-01 DIAGNOSIS — U071 COVID-19: Secondary | ICD-10-CM | POA: Diagnosis not present

## 2019-03-01 DIAGNOSIS — Z23 Encounter for immunization: Secondary | ICD-10-CM | POA: Insufficient documentation

## 2019-03-01 MED ORDER — DIPHENHYDRAMINE HCL 50 MG/ML IJ SOLN: 50.0000 mg | Freq: Once | INTRAMUSCULAR | Status: DC | PRN

## 2019-03-01 MED ORDER — FAMOTIDINE IN NACL 20-0.9 MG/50ML-% IV SOLN: 20.0000 mg | Freq: Once | INTRAVENOUS | Status: DC | PRN

## 2019-03-01 MED ORDER — SODIUM CHLORIDE 0.9 % IV SOLN
700.0000 mg | Freq: Once | INTRAVENOUS | Status: AC
  Administered 2019-03-01: 700 mg via INTRAVENOUS
  Filled 2019-03-01: qty 20

## 2019-03-01 MED ORDER — METHYLPREDNISOLONE SODIUM SUCC 125 MG IJ SOLR: 125.0000 mg | Freq: Once | INTRAMUSCULAR | Status: DC | PRN

## 2019-03-01 MED ORDER — EPINEPHRINE 0.3 MG/0.3ML IJ SOAJ: 0.3000 mg | Freq: Once | INTRAMUSCULAR | Status: DC | PRN

## 2019-03-01 MED ORDER — SODIUM CHLORIDE 0.9 % IV SOLN
INTRAVENOUS | Status: DC | PRN
  Administered 2019-03-01: 250 mL via INTRAVENOUS

## 2019-03-01 MED ORDER — ALBUTEROL SULFATE HFA 108 (90 BASE) MCG/ACT IN AERS: 2.0000 | INHALATION_SPRAY | Freq: Once | RESPIRATORY_TRACT | Status: DC | PRN

## 2019-03-02 ENCOUNTER — Ambulatory Visit (HOSPITAL_COMMUNITY): Payer: No Typology Code available for payment source

## 2020-06-18 ENCOUNTER — Other Ambulatory Visit: Payer: Self-pay

## 2020-06-18 ENCOUNTER — Encounter: Payer: No Typology Code available for payment source | Attending: Pediatrics

## 2020-06-18 DIAGNOSIS — J439 Emphysema, unspecified: Secondary | ICD-10-CM | POA: Insufficient documentation

## 2020-06-18 NOTE — Progress Notes (Signed)
Virtual Visit completed. Patient informed on EP and RD appointment and 6 Minute walk test. Patient also informed of patient health questionnaires on My Chart. Patient Verbalizes understanding. Visit diagnosis can be found in [facility] under Media. Patients [facility] orders to be scanned.

## 2020-06-27 ENCOUNTER — Other Ambulatory Visit: Payer: Self-pay

## 2020-06-27 DIAGNOSIS — J439 Emphysema, unspecified: Secondary | ICD-10-CM

## 2020-06-27 NOTE — Patient Instructions (Addendum)
Patient Instructions  Patient Details  Name: Duane Klein MRN: [id number] Date of Birth: 23-Jul-1950 Referring Provider:  Ruthell Rummage*  Below are your personal goals for exercise, nutrition, and risk factors. Our goal is to help you stay on track towards obtaining and maintaining these goals. We will be discussing your progress on these goals with you throughout the program.  Initial Exercise Prescription:  Initial Exercise Prescription - 06/27/20 1500      Date of Initial Exercise RX and Referring Provider   Date 06/27/20    Referring Provider [name]      Treadmill   MPH 2.5    Grade 1    Minutes 15    METs 3.3      Recumbant Bike   Level 3    RPM 60    Watts 40    Minutes 15    METs 3.3      NuStep   Level 3    SPM 80    Minutes 15    METs 3.3      Recumbant Elliptical   Level 2    RPM 50    Minutes 15    METs 3.3      T5 Nustep   Level 2    SPM 80    Minutes 15    METs 3.3      Prescription Details   Frequency (times per week) 3    Duration Progress to 30 minutes of continuous aerobic without signs/symptoms of physical distress      Intensity   THRR 40-80% of Max Heartrate 118-141    Ratings of Perceived Exertion 11-15    Perceived Dyspnea 0-4      Resistance Training   Training Prescription Yes    Weight 3 lb    Reps 10-15           Exercise Goals: Frequency: Be able to perform aerobic exercise two to three times per week in program working toward 2-5 days per week of home exercise.  Intensity: Work with a perceived exertion of 11 (fairly light) - 15 (hard) while following your exercise prescription.  We will make changes to your prescription with you as you progress through the program.   Duration: Be able to do 30 to 45 minutes of continuous aerobic exercise in addition to a 5 minute warm-up and a 5 minute cool-down routine.   Nutrition Goals: Your personal nutrition goals will be established when you do your nutrition  analysis with the dietician.  The following are general nutrition guidelines to follow: Cholesterol < 200mg /day Sodium < 1500mg /day Fiber: Men over 50 yrs - 30 grams per day  Personal Goals:  Personal Goals and Risk Factors at Admission - 06/27/20 1539      Core Components/Risk Factors/Patient Goals on Admission    Weight Management Yes;Weight Loss    Intervention Weight Management: Develop a combined nutrition and exercise program designed to reach desired caloric intake, while maintaining appropriate intake of nutrient and fiber, sodium and fats, and appropriate energy expenditure required for the weight goal.;Weight Management: Provide education and appropriate resources to help participant work on and attain dietary goals.;Weight Management/Obesity: Establish reasonable short term and long term weight goals.    Expected Outcomes Long Term: Adherence to nutrition and physical activity/exercise program aimed toward attainment of established weight goal;Short Term: Continue to assess and modify interventions until short term weight is achieved;Weight Loss: Understanding of general recommendations for a balanced deficit meal plan, which promotes 1-2  lb weight loss per week and includes a negative energy balance of [phone] kcal/d;Understanding recommendations for meals to include 15-35% energy as protein, 25-35% energy from fat, 35-60% energy from carbohydrates, less than 200mg  of dietary cholesterol, 20-35 gm of total fiber daily;Understanding of distribution of calorie intake throughout the day with the consumption of 4-5 meals/snacks    Intervention Provide education, individualized exercise plan and daily activity instruction to help decrease symptoms of SOB with activities of daily living.    Expected Outcomes Short Term: Improve cardiorespiratory fitness to achieve a reduction of symptoms when performing ADLs;Long Term: Be able to perform more ADLs without symptoms or delay the onset of symptoms     Hypertension Yes    Intervention Provide education on lifestyle modifcations including regular physical activity/exercise, weight management, moderate sodium restriction and increased consumption of fresh fruit, vegetables, and low fat dairy, alcohol moderation, and smoking cessation.;Monitor prescription use compliance.    Expected Outcomes Short Term: Continued assessment and intervention until BP is < 140/45mm HG in hypertensive participants. < 130/35mm HG in hypertensive participants with diabetes, heart failure or chronic kidney disease.;Long Term: Maintenance of blood pressure at goal levels.    Lipids Yes    Intervention Provide education and support for participant on nutrition & aerobic/resistive exercise along with prescribed medications to achieve LDL 70mg , HDL >40mg .    Expected Outcomes Short Term: Participant states understanding of desired cholesterol values and is compliant with medications prescribed. Participant is following exercise prescription and nutrition guidelines.;Long Term: Cholesterol controlled with medications as prescribed, with individualized exercise RX and with personalized nutrition plan. Value goals: LDL < 70mg , HDL > 40 mg.           Tobacco Use Initial Evaluation: Social History   Tobacco Use  Smoking Status Former Smoker  . Packs/day: 3.00  . Years: 35.00  . Pack years: 105.00  . Types: Cigarettes  . Quit date: 04/09/2002  . Years since quitting: 18.2  Smokeless Tobacco Former Systems developer  . Types: Snuff, Chew  . Quit date: 03/09/1997  Tobacco Comment   patient has not smoked since 2004    Exercise Goals and Review:  Exercise Goals    Row Name 06/27/20 1541             Exercise Goals   Increase Physical Activity Yes       Intervention Provide advice, education, support and counseling about physical activity/exercise needs.;Develop an individualized exercise prescription for aerobic and resistive training based on initial evaluation findings,  risk stratification, comorbidities and participant's personal goals.       Expected Outcomes Short Term: Attend rehab on a regular basis to increase amount of physical activity.;Long Term: Add in home exercise to make exercise part of routine and to increase amount of physical activity.;Long Term: Exercising regularly at least 3-5 days a week.       Increase Strength and Stamina Yes       Intervention Provide advice, education, support and counseling about physical activity/exercise needs.;Develop an individualized exercise prescription for aerobic and resistive training based on initial evaluation findings, risk stratification, comorbidities and participant's personal goals.       Expected Outcomes Short Term: Increase workloads from initial exercise prescription for resistance, speed, and METs.;Short Term: Perform resistance training exercises routinely during rehab and add in resistance training at home;Long Term: Improve cardiorespiratory fitness, muscular endurance and strength as measured by increased METs and functional capacity (6MWT)       Able to understand and use  rate of perceived exertion (RPE) scale Yes       Intervention Provide education and explanation on how to use RPE scale       Expected Outcomes Short Term: Able to use RPE daily in rehab to express subjective intensity level;Long Term:  Able to use RPE to guide intensity level when exercising independently       Able to understand and use Dyspnea scale Yes       Intervention Provide education and explanation on how to use Dyspnea scale       Expected Outcomes Short Term: Able to use Dyspnea scale daily in rehab to express subjective sense of shortness of breath during exertion;Long Term: Able to use Dyspnea scale to guide intensity level when exercising independently       Knowledge and understanding of Target Heart Rate Range (THRR) Yes       Intervention Provide education and explanation of THRR including how the numbers were  predicted and where they are located for reference       Expected Outcomes Short Term: Able to state/look up THRR;Short Term: Able to use daily as guideline for intensity in rehab;Long Term: Able to use THRR to govern intensity when exercising independently       Able to check pulse independently Yes       Intervention Provide education and demonstration on how to check pulse in carotid and radial arteries.;Review the importance of being able to check your own pulse for safety during independent exercise       Expected Outcomes Short Term: Able to explain why pulse checking is important during independent exercise;Long Term: Able to check pulse independently and accurately       Understanding of Exercise Prescription Yes       Intervention Provide education, explanation, and written materials on patient's individual exercise prescription       Expected Outcomes Short Term: Able to explain program exercise prescription;Long Term: Able to explain home exercise prescription to exercise independently              Copy of goals given to participant.

## 2020-06-27 NOTE — Progress Notes (Signed)
Pulmonary Individual Treatment Plan  Patient Details  Name: Duane Klein MRN: [id number] Date of Birth: September 25, 1950 Referring Provider:   Flowsheet Row Pulmonary Rehab from 06/27/2020 in [facility] Cardiac and Pulmonary Rehab  Referring Provider [name]      Initial Encounter Date:  Flowsheet Row Pulmonary Rehab from 06/27/2020 in [facility] Cardiac and Pulmonary Rehab  Date 06/27/20      Visit Diagnosis: Pulmonary emphysema, unspecified emphysema type ([place])  Patient's Home Medications on Admission:  Current Outpatient Medications:  .  acetaminophen (TYLENOL) 325 MG tablet, TAKE TWO TABLETS BY MOUTH AS NEEDED, Disp: , Rfl:  .  albuterol (PROVENTIL) (2.5 MG/3ML) 0.083% nebulizer solution, Take 3 mLs (2.5 mg total) by nebulization every 6 (six) hours as needed for wheezing or shortness of breath., Disp: 75 mL, Rfl: 1 .  albuterol (VENTOLIN HFA) 108 (90 Base) MCG/ACT inhaler, INHALE 2 PUFFS BY ORAL INHALATION EVERY 6 HOURS FOR BREATHING. BE SURE TO [place] MOUTHPIECE WITH WARM WATER ONCE A WEEK, Disp: , Rfl:  .  amitriptyline (ELAVIL) 10 MG tablet, Take 2 tablets by mouth at bedtime., Disp: , Rfl:  .  amitriptyline (ELAVIL) 100 MG tablet, Take 100 mg by mouth at bedtime. (Patient not taking: Reported on 06/18/2020), Disp: , Rfl:  .  aspirin 81 MG EC tablet, Take by mouth., Disp: , Rfl:  .  chlorpheniramine (CHLOR-TRIMETON) 4 MG tablet, Take 4 mg by mouth 2 (two) times daily as needed for allergies., Disp: , Rfl:  .  colchicine 0.6 MG tablet, TAKE TABLET(S) BY MOUTH AS DIRECTED : 2 TABLETS TO START, THEN ONE TABLET ONE HOUR LATER; MAY REPEAT IN 3 DAYS, Disp: , Rfl:  .  diclofenac sodium (VOLTAREN) 1 % GEL, Apply 2 g topically 4 (four) times daily., Disp: , Rfl:  .  diltiazem (CARDIZEM) 120 MG tablet, Take 120 mg by mouth 4 (four) times daily., Disp: , Rfl:  .  diltiazem (TIAZAC) 120 MG 24 hr capsule, Take by mouth., Disp: , Rfl:  .  ergocalciferol (VITAMIN D2) 1.25 MG (50000 UT) capsule, Take by mouth.,  Disp: , Rfl:  .  fluticasone (FLONASE) 50 MCG/ACT nasal spray, INSTILL 2 SPRAYS INTO EACH NOSTRIL EVERY DAY MAXIMUM 2 SPRAYS IN EACH NOSTRIL DAILY. FOR ALLERGIES TILT HEAD FORWARD, AIM AS DIRECTED, Disp: , Rfl:  .  lisinopril (ZESTRIL) 10 MG tablet, Take 10 mg by mouth daily. (Patient not taking: Reported on 06/18/2020), Disp: , Rfl:  .  lisinopril (ZESTRIL) 20 MG tablet, TAKE ONE-HALF TABLET BY MOUTH EVERY DAY FOR BLOOD PRESSURE, Disp: , Rfl:  .  loratadine (CLARITIN) 10 MG tablet, Take 1 tablet by mouth daily., Disp: , Rfl:  .  methocarbamol (ROBAXIN) 750 MG tablet, Take 750 mg by mouth 4 (four) times daily., Disp: , Rfl:  .  mometasone (ASMANEX) 220 MCG/INH inhaler, Inhale 2 puffs into the lungs daily., Disp: , Rfl:  .  montelukast (SINGULAIR) 10 MG tablet, TAKE 1 TABLET(10 MG) BY MOUTH EVERY NIGHT, Disp: , Rfl:  .  Multiple Vitamin (MULTI-VITAMIN) tablet, Take 1 tablet by mouth daily., Disp: , Rfl:  .  Multiple Vitamins-Minerals (MULTIVITAMIN ADULT EXTRA C PO), TAKE 1 TABLET BY MOUTH ONCE EVERY DAY FOR USE AS A VITAMIN SUPPLEMENT (Patient not taking: Reported on 06/18/2020), Disp: , Rfl:  .  omeprazole (PRILOSEC OTC) 20 MG tablet, Take by mouth. (Patient not taking: Reported on 06/18/2020), Disp: , Rfl:  .  omeprazole (PRILOSEC) 20 MG capsule, Take 20 mg by mouth daily. (Patient not taking: Reported  on 06/18/2020), Disp: , Rfl:  .  omeprazole (PRILOSEC) 40 MG capsule, TAKE ONE CAPSULE BY MOUTH TWICE DAILY BEFORE MORNING AND EVENING MEAL TO CONTROL STOMACH ACID.  TAKE 30 MINUTES BEFORE A MEAL, Disp: , Rfl:  .  ondansetron (ZOFRAN ODT) 4 MG disintegrating tablet, Take 1 tablet (4 mg total) by mouth every 8 (eight) hours as needed for nausea or vomiting., Disp: 10 tablet, Rfl: 0 .  predniSONE (DELTASONE) 10 MG tablet, Take 1 tablet (10 mg total) by mouth daily. Day 1-3: take 4 tablets PO daily Day 4-6: take 3 tablets PO daily Day 7-9: take 2 tablets PO daily Day 10-12: take 1 tablet PO daily (Patient not  taking: Reported on 06/18/2020), Disp: 30 tablet, Rfl: 0 .  simvastatin (ZOCOR) 20 MG tablet, Take 20 mg by mouth daily. (Patient not taking: Reported on 06/18/2020), Disp: , Rfl:  .  simvastatin (ZOCOR) 20 MG tablet, TAKE ONE-HALF TABLET BY MOUTH AT BEDTIME FOR CHOLESTEROL, Disp: , Rfl:  .  terazosin (HYTRIN) 10 MG capsule, Take 10 mg by mouth at bedtime. (Patient not taking: Reported on 06/18/2020), Disp: , Rfl:  .  terazosin (HYTRIN) 5 MG capsule, Take 1 capsule by mouth at bedtime., Disp: , Rfl:  .  Tiotropium Bromide-Olodaterol 2.5-2.5 MCG/ACT AERS, Inhale into the lungs once. (Patient not taking: Reported on 06/18/2020), Disp: , Rfl:  .  Tiotropium Bromide-Olodaterol 2.5-2.5 MCG/ACT AERS, Inhale into the lungs., Disp: , Rfl:  .  topiramate (TOPAMAX) 100 MG tablet, Take 100 mg by mouth 2 (two) times daily., Disp: , Rfl:   Past Medical History: Past Medical History:  Diagnosis Date  . Atrial fibrillation ([place])   . Cancer of kidney ([place])   . Emphysema lung ([place])   . Hypertension     Tobacco Use: Social History   Tobacco Use  Smoking Status Former Smoker  . Packs/day: 3.00  . Years: 35.00  . Pack years: 105.00  . Types: Cigarettes  . Quit date: 04/09/2002  . Years since quitting: 18.2  Smokeless Tobacco Former Systems developer  . Types: Snuff, Chew  . Quit date: 03/09/1997  Tobacco Comment   patient has not smoked since 2004    Labs: Recent Review Flowsheet Data   There is no flowsheet data to display.      Pulmonary Assessment Scores:  Pulmonary Assessment Scores    Row Name 06/27/20 1532         ADL UCSD   ADL Phase Entry     SOB Score total 20     Rest 0     Walk 1     Stairs 3     Bath 0     Dress 1     Shop 2           CAT Score   CAT Score 15            UCSD: Self-administered rating of dyspnea associated with activities of daily living (ADLs) 6-point scale (0 = "not at all" to 5 = "maximal or unable to do because of breathlessness")  Scoring Scores range from  0 to 120.  Minimally important difference is 5 units  CAT: CAT can identify the health impairment of COPD patients and is better correlated with disease progression.  CAT has a scoring range of zero to 40. The CAT score is classified into four groups of low (less than 10), medium (10 - 20), high (21-30) and very high (31-40) based on the impact level of disease  on health status. A CAT score over 10 suggests significant symptoms.  A worsening CAT score could be explained by an exacerbation, poor medication adherence, poor inhaler technique, or progression of COPD or comorbid conditions.  CAT MCID is 2 points  mMRC: mMRC (Modified Medical Research Council) Dyspnea Scale is used to assess the degree of baseline functional disability in patients of respiratory disease due to dyspnea. No minimal important difference is established. A decrease in score of 1 point or greater is considered a positive change.   Pulmonary Function Assessment:  Pulmonary Function Assessment - 06/18/20 0937      Breath   Shortness of Breath Yes;Limiting activity           Exercise Target Goals: Exercise Program Goal: Individual exercise prescription set using results from initial 6 min walk test and THRR while considering  patient's activity barriers and safety.   Exercise Prescription Goal: Initial exercise prescription builds to 30-45 minutes a day of aerobic activity, 2-3 days per week.  Home exercise guidelines will be given to patient during program as part of exercise prescription that the participant will acknowledge.  Education: Aerobic Exercise: - Group verbal and visual presentation on the components of exercise prescription. Introduces F.I.T.T principle from ACSM for exercise prescriptions.  Reviews F.I.T.T. principles of aerobic exercise including progression. Written material given at graduation.   Education: Resistance Exercise: - Group verbal and visual presentation on the components of exercise  prescription. Introduces F.I.T.T principle from ACSM for exercise prescriptions  Reviews F.I.T.T. principles of resistance exercise including progression. Written material given at graduation.    Education: Exercise & Equipment Safety: - Individual verbal instruction and demonstration of equipment use and safety with use of the equipment. Flowsheet Row Pulmonary Rehab from 06/27/2020 in [facility] Cardiac and Pulmonary Rehab  Date 06/27/20  Educator AS  Instruction Review Code 1- Verbalizes Understanding      Education: Exercise Physiology & General Exercise Guidelines: - Group verbal and written instruction with models to review the exercise physiology of the cardiovascular system and associated critical values. Provides general exercise guidelines with specific guidelines to those with heart or lung disease.    Education: Flexibility, Balance, Mind/Body Relaxation: - Group verbal and visual presentation with interactive activity on the components of exercise prescription. Introduces F.I.T.T principle from ACSM for exercise prescriptions. Reviews F.I.T.T. principles of flexibility and balance exercise training including progression. Also discusses the mind body connection.  Reviews various relaxation techniques to help reduce and manage stress (i.e. Deep breathing, progressive muscle relaxation, and visualization). Balance handout provided to take home. Written material given at graduation.   Activity Barriers & Risk Stratification:   6 Minute Walk:  6 Minute Walk    Row Name 06/27/20 1519         6 Minute Walk   Phase Initial     Distance 1300 feet     Walk Time 6 minutes     # of Rest Breaks 0     MPH 2.46     METS 3.32     RPE 9     Perceived Dyspnea  1     VO2 Peak 11.65     Symptoms Yes (comment)     Comments Si joint pain 4/10 (chronic)     Resting HR 94 bpm     Resting BP 128/64     Resting Oxygen Saturation  93 %     Exercise Oxygen Saturation  during 6 min walk 89 %  Max Ex. HR 105 bpm     Max Ex. BP 138/76     2 Minute Post BP 112/60           Interval HR   1 Minute HR 101     2 Minute HR 100     3 Minute HR 104     4 Minute HR 100     5 Minute HR 88     6 Minute HR 105     2 Minute Post HR 82     Interval Heart Rate? Yes           Interval Oxygen   Interval Oxygen? Yes     Baseline Oxygen Saturation % 93 %     1 Minute Oxygen Saturation % 93 %     1 Minute Liters of Oxygen 0 L     2 Minute Oxygen Saturation % 91 %     2 Minute Liters of Oxygen 0 L     3 Minute Oxygen Saturation % 89 %     3 Minute Liters of Oxygen 0 L     4 Minute Oxygen Saturation % 91 %     4 Minute Liters of Oxygen 0 L     5 Minute Oxygen Saturation % 91 %     5 Minute Liters of Oxygen 0 L     6 Minute Oxygen Saturation % 91 %     6 Minute Liters of Oxygen 0 L     2 Minute Post Oxygen Saturation % 93 %     2 Minute Post Liters of Oxygen 0 L           Oxygen Initial Assessment:  Oxygen Initial Assessment - 06/18/20 0937      Home Oxygen   Home Oxygen Device None    Sleep Oxygen Prescription None    Home Exercise Oxygen Prescription None    Home Resting Oxygen Prescription None      Initial 6 min Walk   Oxygen Used None      Program Oxygen Prescription   Program Oxygen Prescription None      Intervention   Short Term Goals To learn and understand importance of monitoring SPO2 with pulse oximeter and demonstrate accurate use of the pulse oximeter.;To learn and understand importance of maintaining oxygen saturations>88%;To learn and demonstrate proper pursed lip breathing techniques or other breathing techniques.;To learn and demonstrate proper use of respiratory medications    Long  Term Goals Verbalizes importance of monitoring SPO2 with pulse oximeter and return demonstration;Maintenance of O2 saturations>88%;Exhibits proper breathing techniques, such as pursed lip breathing or other method taught during program session;Compliance with respiratory  medication;Demonstrates proper use of MDI's           Oxygen Re-Evaluation:   Oxygen Discharge (Final Oxygen Re-Evaluation):   Initial Exercise Prescription:  Initial Exercise Prescription - 06/27/20 1500      Date of Initial Exercise RX and Referring Provider   Date 06/27/20    Referring Provider [name]      Treadmill   MPH 2.5    Grade 1    Minutes 15    METs 3.3      Recumbant Bike   Level 3    RPM 60    Watts 40    Minutes 15    METs 3.3      NuStep   Level 3    SPM 80    Minutes 15    METs  3.3      Recumbant Elliptical   Level 2    RPM 50    Minutes 15    METs 3.3      T5 Nustep   Level 2    SPM 80    Minutes 15    METs 3.3      Prescription Details   Frequency (times per week) 3    Duration Progress to 30 minutes of continuous aerobic without signs/symptoms of physical distress      Intensity   THRR 40-80% of Max Heartrate 118-141    Ratings of Perceived Exertion 11-15    Perceived Dyspnea 0-4      Resistance Training   Training Prescription Yes    Weight 3 lb    Reps 10-15           Perform Capillary Blood Glucose checks as needed.  Exercise Prescription Changes:  Exercise Prescription Changes    Row Name 06/27/20 1500             Response to Exercise   Blood Pressure (Admit) 128/64       Blood Pressure (Exercise) 138/76       Blood Pressure (Exit) 112/60       Heart Rate (Admit) 94 bpm       Heart Rate (Exercise) 105 bpm       Heart Rate (Exit) 82 bpm       Oxygen Saturation (Admit) 93 %       Oxygen Saturation (Exercise) 89 %       Oxygen Saturation (Exit) 93 %       Rating of Perceived Exertion (Exercise) 9       Perceived Dyspnea (Exercise) 1       Symptoms SI joint pain 4/10              Exercise Comments:   Exercise Goals and Review:  Exercise Goals    Row Name 06/27/20 1541             Exercise Goals   Increase Physical Activity Yes       Intervention Provide advice, education, support and  counseling about physical activity/exercise needs.;Develop an individualized exercise prescription for aerobic and resistive training based on initial evaluation findings, risk stratification, comorbidities and participant's personal goals.       Expected Outcomes Short Term: Attend rehab on a regular basis to increase amount of physical activity.;Long Term: Add in home exercise to make exercise part of routine and to increase amount of physical activity.;Long Term: Exercising regularly at least 3-5 days a week.       Increase Strength and Stamina Yes       Intervention Provide advice, education, support and counseling about physical activity/exercise needs.;Develop an individualized exercise prescription for aerobic and resistive training based on initial evaluation findings, risk stratification, comorbidities and participant's personal goals.       Expected Outcomes Short Term: Increase workloads from initial exercise prescription for resistance, speed, and METs.;Short Term: Perform resistance training exercises routinely during rehab and add in resistance training at home;Long Term: Improve cardiorespiratory fitness, muscular endurance and strength as measured by increased METs and functional capacity (6MWT)       Able to understand and use rate of perceived exertion (RPE) scale Yes       Intervention Provide education and explanation on how to use RPE scale       Expected Outcomes Short Term: Able to use RPE daily in rehab to  express subjective intensity level;Long Term:  Able to use RPE to guide intensity level when exercising independently       Able to understand and use Dyspnea scale Yes       Intervention Provide education and explanation on how to use Dyspnea scale       Expected Outcomes Short Term: Able to use Dyspnea scale daily in rehab to express subjective sense of shortness of breath during exertion;Long Term: Able to use Dyspnea scale to guide intensity level when exercising independently        Knowledge and understanding of Target Heart Rate Range (THRR) Yes       Intervention Provide education and explanation of THRR including how the numbers were predicted and where they are located for reference       Expected Outcomes Short Term: Able to state/look up THRR;Short Term: Able to use daily as guideline for intensity in rehab;Long Term: Able to use THRR to govern intensity when exercising independently       Able to check pulse independently Yes       Intervention Provide education and demonstration on how to check pulse in carotid and radial arteries.;Review the importance of being able to check your own pulse for safety during independent exercise       Expected Outcomes Short Term: Able to explain why pulse checking is important during independent exercise;Long Term: Able to check pulse independently and accurately       Understanding of Exercise Prescription Yes       Intervention Provide education, explanation, and written materials on patient's individual exercise prescription       Expected Outcomes Short Term: Able to explain program exercise prescription;Long Term: Able to explain home exercise prescription to exercise independently              Exercise Goals Re-Evaluation :   Discharge Exercise Prescription (Final Exercise Prescription Changes):  Exercise Prescription Changes - 06/27/20 1500      Response to Exercise   Blood Pressure (Admit) 128/64    Blood Pressure (Exercise) 138/76    Blood Pressure (Exit) 112/60    Heart Rate (Admit) 94 bpm    Heart Rate (Exercise) 105 bpm    Heart Rate (Exit) 82 bpm    Oxygen Saturation (Admit) 93 %    Oxygen Saturation (Exercise) 89 %    Oxygen Saturation (Exit) 93 %    Rating of Perceived Exertion (Exercise) 9    Perceived Dyspnea (Exercise) 1    Symptoms SI joint pain 4/10           Nutrition:  Target Goals: Understanding of nutrition guidelines, daily intake of sodium 1500mg , cholesterol 200mg , calories 30%  from fat and 7% or less from saturated fats, daily to have 5 or more servings of fruits and vegetables.  Education: All About Nutrition: -Group instruction provided by verbal, written material, interactive activities, discussions, models, and posters to present general guidelines for heart healthy nutrition including fat, fiber, MyPlate, the role of sodium in heart healthy nutrition, utilization of the nutrition label, and utilization of this knowledge for meal planning. Follow up email sent as well. Written material given at graduation.   Biometrics:    Nutrition Therapy Plan and Nutrition Goals:   Nutrition Assessments:  MEDIFICTS Score Key:  ?70 Need to make dietary changes   40-70 Heart Healthy Diet  ? 40 Therapeutic Level Cholesterol Diet  Flowsheet Row Pulmonary Rehab from 06/27/2020 in [facility] Cardiac and Pulmonary Rehab  Picture Your  Plate Total Score on Admission 31     Picture Your Plate Scores:  <51 Unhealthy dietary pattern with much room for improvement.  41-50 Dietary pattern unlikely to meet recommendations for good health and room for improvement.  51-60 More healthful dietary pattern, with some room for improvement.   >60 Healthy dietary pattern, although there may be some specific behaviors that could be improved.   Nutrition Goals Re-Evaluation:   Nutrition Goals Discharge (Final Nutrition Goals Re-Evaluation):   Psychosocial: Target Goals: Acknowledge presence or absence of significant depression and/or stress, maximize coping skills, provide positive support system. Participant is able to verbalize types and ability to use techniques and skills needed for reducing stress and depression.   Education: Stress, Anxiety, and Depression - Group verbal and visual presentation to define topics covered.  Reviews how body is impacted by stress, anxiety, and depression.  Also discusses healthy ways to reduce stress and to treat/manage anxiety and depression.   Written material given at graduation.   Education: Sleep Hygiene -Provides group verbal and written instruction about how sleep can affect your health.  Define sleep hygiene, discuss sleep cycles and impact of sleep habits. Review good sleep hygiene tips.    Initial Review & Psychosocial Screening:  Initial Psych Review & Screening - 06/18/20 0941      Initial Review   Current issues with None Identified      Family Dynamics   Good Support System? Yes    Comments His wife and his ministers are a good support system for him.      Barriers   Psychosocial barriers to participate in program The patient should benefit from training in stress management and relaxation.      Screening Interventions   Interventions Encouraged to exercise;To provide support and resources with identified psychosocial needs;Provide feedback about the scores to participant    Expected Outcomes Short Term goal: Utilizing psychosocial counselor, staff and physician to assist with identification of specific Stressors or current issues interfering with healing process. Setting desired goal for each stressor or current issue identified.;Long Term Goal: Stressors or current issues are controlled or eliminated.;Short Term goal: Identification and review with participant of any Quality of Life or Depression concerns found by scoring the questionnaire.;Long Term goal: The participant improves quality of Life and PHQ9 Scores as seen by post scores and/or verbalization of changes           Quality of Life Scores:  Scores of 19 and below usually indicate a poorer quality of life in these areas.  A difference of  2-3 points is a clinically meaningful difference.  A difference of 2-3 points in the total score of the Quality of Life Index has been associated with significant improvement in overall quality of life, self-image, physical symptoms, and general health in studies assessing change in quality of life.  PHQ-9: Recent  Review Flowsheet Data    Depression screen [facility] 2/9 06/27/2020 08/02/2018 07/05/2018 11/17/2016   Decreased Interest 1 0 0 0   Down, Depressed, Hopeless 0 0 0 0   PHQ - 2 Score 1 0 0 0   Altered sleeping 2  - - 0   Tired, decreased energy 2 - - 1   Change in appetite 0 - - 0   Feeling bad or failure about yourself  0 - - 0   Trouble concentrating 0 - - 0   Moving slowly or fidgety/restless 2 - - 0   Suicidal thoughts 0 - - 0  PHQ-9 Score 7 - - 1   Difficult doing work/chores - - - Somewhat difficult     Interpretation of Total Score  Total Score Depression Severity:  1-4 = Minimal depression, 5-9 = Mild depression, 10-14 = Moderate depression, 15-19 = Moderately severe depression, 20-27 = Severe depression   Psychosocial Evaluation and Intervention:  Psychosocial Evaluation - 06/18/20 0942      Psychosocial Evaluation & Interventions   Interventions Encouraged to exercise with the program and follow exercise prescription;Relaxation education;Stress management education    Comments His wife and his ministers are a good support system for him.    Expected Outcomes Short: Exercise regularly to support mental health and notify staff of any changes. Long: maintain mental health and well being through teaching of rehab or prescribed medications independently.    Continue Psychosocial Services  Follow up required by staff           Psychosocial Re-Evaluation:   Psychosocial Discharge (Final Psychosocial Re-Evaluation):   Education: Education Goals: Education classes will be provided on a weekly basis, covering required topics. Participant will state understanding/return demonstration of topics presented.  Learning Barriers/Preferences:  Learning Barriers/Preferences - 06/18/20 2694      Learning Barriers/Preferences   Learning Barriers None    Learning Preferences None           General Pulmonary Education Topics:  Infection Prevention: - Provides verbal and written  material to individual with discussion of infection control including proper hand washing and proper equipment cleaning during exercise session. Flowsheet Row Pulmonary Rehab from 06/27/2020 in [facility] Cardiac and Pulmonary Rehab  Date 06/27/20  Educator AS  Instruction Review Code 1- Verbalizes Understanding      Falls Prevention: - Provides verbal and written material to individual with discussion of falls prevention and safety. Flowsheet Row Pulmonary Rehab from 06/27/2020 in [facility] Cardiac and Pulmonary Rehab  Date 06/27/20  Educator AS  Instruction Review Code 1- Verbalizes Understanding      Chronic Lung Disease Review: - Group verbal instruction with posters, models, PowerPoint presentations and videos,  to review new updates, new respiratory medications, new advancements in procedures and treatments. Providing information on websites and "800" numbers for continued self-education. Includes information about supplement oxygen, available portable oxygen systems, continuous and intermittent flow rates, oxygen safety, concentrators, and Medicare reimbursement for oxygen. Explanation of Pulmonary Drugs, including class, frequency, complications, importance of spacers, rinsing mouth after steroid MDI's, and proper cleaning methods for nebulizers. Review of basic lung anatomy and physiology related to function, structure, and complications of lung disease. Review of risk factors. Discussion about methods for diagnosing sleep apnea and types of masks and machines for OSA. Includes a review of the use of types of environmental controls: home humidity, furnaces, filters, dust mite/pet prevention, HEPA vacuums. Discussion about weather changes, air quality and the benefits of nasal washing. Instruction on Warning signs, infection symptoms, calling MD promptly, preventive modes, and value of vaccinations. Review of effective airway clearance, coughing and/or vibration techniques. Emphasizing that all should  Create an Action Plan. Written material given at graduation. Flowsheet Row Pulmonary Rehab from 12/09/2016 in [facility] Cardiac and Pulmonary Rehab  Date 12/09/16  Educator [facility]  Instruction Review Code 1- Verbalizes Understanding      AED/CPR: - Group verbal and written instruction with the use of models to demonstrate the basic use of the AED with the basic ABC's of resuscitation.    Anatomy and Cardiac Procedures: - Group verbal and visual presentation and models provide information  about basic cardiac anatomy and function. Reviews the testing methods done to diagnose heart disease and the outcomes of the test results. Describes the treatment choices: Medical Management, Angioplasty, or Coronary Bypass Surgery for treating various heart conditions including Myocardial Infarction, Angina, Valve Disease, and Cardiac Arrhythmias.  Written material given at graduation.   Medication Safety: - Group verbal and visual instruction to review commonly prescribed medications for heart and lung disease. Reviews the medication, class of the drug, and side effects. Includes the steps to properly store meds and maintain the prescription regimen.  Written material given at graduation.   Other: -Provides group and verbal instruction on various topics (see comments)   Knowledge Questionnaire Score:  Knowledge Questionnaire Score - 06/27/20 1534      Knowledge Questionnaire Score   Pre Score 15/18            Core Components/Risk Factors/Patient Goals at Admission:  Personal Goals and Risk Factors at Admission - 06/27/20 1539      Core Components/Risk Factors/Patient Goals on Admission    Weight Management Yes;Weight Loss    Intervention Weight Management: Develop a combined nutrition and exercise program designed to reach desired caloric intake, while maintaining appropriate intake of nutrient and fiber, sodium and fats, and appropriate energy expenditure required for the weight goal.;Weight  Management: Provide education and appropriate resources to help participant work on and attain dietary goals.;Weight Management/Obesity: Establish reasonable short term and long term weight goals.    Expected Outcomes Long Term: Adherence to nutrition and physical activity/exercise program aimed toward attainment of established weight goal;Short Term: Continue to assess and modify interventions until short term weight is achieved;Weight Loss: Understanding of general recommendations for a balanced deficit meal plan, which promotes 1-2 lb weight loss per week and includes a negative energy balance of [phone] kcal/d;Understanding recommendations for meals to include 15-35% energy as protein, 25-35% energy from fat, 35-60% energy from carbohydrates, less than 200mg  of dietary cholesterol, 20-35 gm of total fiber daily;Understanding of distribution of calorie intake throughout the day with the consumption of 4-5 meals/snacks    Intervention Provide education, individualized exercise plan and daily activity instruction to help decrease symptoms of SOB with activities of daily living.    Expected Outcomes Short Term: Improve cardiorespiratory fitness to achieve a reduction of symptoms when performing ADLs;Long Term: Be able to perform more ADLs without symptoms or delay the onset of symptoms    Hypertension Yes    Intervention Provide education on lifestyle modifcations including regular physical activity/exercise, weight management, moderate sodium restriction and increased consumption of fresh fruit, vegetables, and low fat dairy, alcohol moderation, and smoking cessation.;Monitor prescription use compliance.    Expected Outcomes Short Term: Continued assessment and intervention until BP is < 140/13mm HG in hypertensive participants. < 130/14mm HG in hypertensive participants with diabetes, heart failure or chronic kidney disease.;Long Term: Maintenance of blood pressure at goal levels.    Lipids Yes     Intervention Provide education and support for participant on nutrition & aerobic/resistive exercise along with prescribed medications to achieve LDL 70mg , HDL >40mg .    Expected Outcomes Short Term: Participant states understanding of desired cholesterol values and is compliant with medications prescribed. Participant is following exercise prescription and nutrition guidelines.;Long Term: Cholesterol controlled with medications as prescribed, with individualized exercise RX and with personalized nutrition plan. Value goals: LDL < 70mg , HDL > 40 mg.           Education:Diabetes - Individual verbal and written instruction to review  signs/symptoms of diabetes, desired ranges of glucose level fasting, after meals and with exercise. Acknowledge that pre and post exercise glucose checks will be done for 3 sessions at entry of program.   Know Your Numbers and Heart Failure: - Group verbal and visual instruction to discuss disease risk factors for cardiac and pulmonary disease and treatment options.  Reviews associated critical values for Overweight/Obesity, Hypertension, Cholesterol, and Diabetes.  Discusses basics of heart failure: signs/symptoms and treatments.  Introduces Heart Failure Zone chart for action plan for heart failure.  Written material given at graduation.   Core Components/Risk Factors/Patient Goals Review:    Core Components/Risk Factors/Patient Goals at Discharge (Final Review):    ITP Comments:  ITP Comments    Row Name 06/18/20 1001           ITP Comments Virtual Visit completed. Patient informed on EP and RD appointment and 6 Minute walk test. Patient also informed of patient health questionnaires on My Chart. Patient Verbalizes understanding. Visit diagnosis can be found in [facility] under Media. Patients [facility] orders to be scanned.              Comments: initial ITP

## 2020-07-01 ENCOUNTER — Other Ambulatory Visit: Payer: Self-pay

## 2020-07-01 DIAGNOSIS — J439 Emphysema, unspecified: Secondary | ICD-10-CM | POA: Diagnosis not present

## 2020-07-01 NOTE — Progress Notes (Signed)
Daily Session Note  Patient Details  Name: Duane Klein MRN: [id number] Date of Birth: 01-30-1951 Referring Provider:   Flowsheet Row Pulmonary Rehab from 06/27/2020 in [facility] Cardiac and Pulmonary Rehab  Referring Provider [name]      Encounter Date: 07/01/2020  Check In:  Session Check In - 07/01/20 1411      Check-In   Supervising physician immediately available to respond to emergencies See telemetry face sheet for immediately available ER MD    Location [facility]-Cardiac & Pulmonary Rehab    Staff Present Birdie Sons, MPA, Mauricia Area, BS, ACSM CEP, Exercise Physiologist;Kara Eliezer Bottom, MS Exercise Physiologist    Virtual Visit No    Medication changes reported     No    Fall or balance concerns reported    No    Tobacco Cessation No Change    Warm-up and Cool-down Performed on first and last piece of equipment    Resistance Training Performed Yes    VAD Patient? No    PAD/SET Patient? No      Pain Assessment   Currently in Pain? No/denies              Social History   Tobacco Use  Smoking Status Former Smoker  . Packs/day: 3.00  . Years: 35.00  . Pack years: 105.00  . Types: Cigarettes  . Quit date: 04/09/2002  . Years since quitting: 18.2  Smokeless Tobacco Former Systems developer  . Types: Snuff, Chew  . Quit date: 03/09/1997  Tobacco Comment   patient has not smoked since 2004    Goals Met:  Independence with exercise equipment Exercise tolerated well No report of cardiac concerns or symptoms Strength training completed today  Goals Unmet:  Not Applicable  Comments: First full day of exercise!  Patient was oriented to gym and equipment including functions, settings, policies, and procedures.  Patient's individual exercise prescription and treatment plan were reviewed.  All starting workloads were established based on the results of the 6 minute walk test done at initial orientation visit.  The plan for exercise progression was also introduced and progression  will be customized based on patient's performance and goals.    Dr. Emily Filbert is Medical Director for [place] and [facility].

## 2020-07-03 ENCOUNTER — Other Ambulatory Visit: Payer: Self-pay

## 2020-07-03 DIAGNOSIS — J439 Emphysema, unspecified: Secondary | ICD-10-CM

## 2020-07-03 NOTE — Progress Notes (Signed)
Daily Session Note  Patient Details  Name: DELONTA YOHANNES MRN: [id number] Date of Birth: [birth year] Referring Provider:   Flowsheet Row Pulmonary Rehab from 06/27/2020 in [facility] Cardiac and Pulmonary Rehab  Referring Provider [name]      Encounter Date: 07/03/2020  Check In:  Session Check In - 07/03/20 1420      Check-In   Supervising physician immediately available to respond to emergencies See telemetry face sheet for immediately available ER MD    Location [facility]-Cardiac & Pulmonary Rehab    Staff Present Birdie Sons, MPA, RN;Joseph Lou Miner, [place] Exercise Physiologist    Virtual Visit No    Medication changes reported     No    Fall or balance concerns reported    No    Tobacco Cessation No Change    Warm-up and Cool-down Performed on first and last piece of equipment    Resistance Training Performed Yes    VAD Patient? No    PAD/SET Patient? No      Pain Assessment   Currently in Pain? No/denies              Social History   Tobacco Use  Smoking Status Former Smoker  . Packs/day: 3.00  . Years: 35.00  . Pack years: 105.00  . Types: Cigarettes  . Quit date: 04/09/2002  . Years since quitting: 18.2  Smokeless Tobacco Former Systems developer  . Types: Snuff, Chew  . Quit date: 03/09/1997  Tobacco Comment   patient has not smoked since 2004    Goals Met:  Independence with exercise equipment Exercise tolerated well No report of cardiac concerns or symptoms Strength training completed today  Goals Unmet:  Not Applicable  Comments: Pt able to follow exercise prescription today without complaint.  Will continue to monitor for progression.    Dr. Emily Filbert is Medical Director for [place] and [facility].

## 2020-07-08 ENCOUNTER — Other Ambulatory Visit: Payer: Self-pay

## 2020-07-08 ENCOUNTER — Encounter: Payer: No Typology Code available for payment source | Attending: Pediatrics

## 2020-07-08 DIAGNOSIS — J439 Emphysema, unspecified: Secondary | ICD-10-CM | POA: Insufficient documentation

## 2020-07-08 NOTE — Progress Notes (Signed)
Daily Session Note  Patient Details  Name: TYLIK TREESE MRN: [id number] Date of Birth: 1950-09-11 Referring Provider:   Flowsheet Row Pulmonary Rehab from 06/27/2020 in [facility] Cardiac and Pulmonary Rehab  Referring Provider [name]      Encounter Date: 07/08/2020  Check In:  Session Check In - 07/08/20 1401      Check-In   Supervising physician immediately available to respond to emergencies See telemetry face sheet for immediately available ER MD    Location [facility]-Cardiac & Pulmonary Rehab    Staff Present Birdie Sons, MPA, Mauricia Area, BS, ACSM CEP, Exercise Physiologist;Kara Eliezer Bottom, MS Exercise Physiologist    Virtual Visit No    Medication changes reported     No    Fall or balance concerns reported    No    Tobacco Cessation No Change    Warm-up and Cool-down Performed on first and last piece of equipment    Resistance Training Performed Yes    VAD Patient? No    PAD/SET Patient? No      Pain Assessment   Currently in Pain? No/denies              Social History   Tobacco Use  Smoking Status Former Smoker  . Packs/day: 3.00  . Years: 35.00  . Pack years: 105.00  . Types: Cigarettes  . Quit date: 04/09/2002  . Years since quitting: 18.2  Smokeless Tobacco Former Systems developer  . Types: Snuff, Chew  . Quit date: 03/09/1997  Tobacco Comment   patient has not smoked since 2004    Goals Met:  Independence with exercise equipment Exercise tolerated well No report of cardiac concerns or symptoms Strength training completed today  Goals Unmet:  Not Applicable  Comments: Pt able to follow exercise prescription today without complaint.  Will continue to monitor for progression.    Dr. Emily Filbert is Medical Director for [place] and [facility].

## 2020-07-18 ENCOUNTER — Telehealth: Payer: Self-pay

## 2020-07-18 NOTE — Telephone Encounter (Signed)
Duane Klein is waiting on his COVID test results and was informed that he will not be able to come back to rehab until he has a negative test and symptom-free. Patient will call us next Monday and let us know his results and how he is feeling.

## 2020-07-22 ENCOUNTER — Other Ambulatory Visit: Payer: Self-pay

## 2020-07-22 DIAGNOSIS — J439 Emphysema, unspecified: Secondary | ICD-10-CM

## 2020-07-22 NOTE — Progress Notes (Signed)
Daily Session Note  Patient Details  Name: Duane Klein MRN: [id number] Date of Birth: 1950-09-06 Referring Provider:   Flowsheet Row Pulmonary Rehab from 06/27/2020 in [facility] Cardiac and Pulmonary Rehab  Referring Provider [name]      Encounter Date: 07/22/2020  Check In:  Session Check In - 07/22/20 1418      Check-In   Supervising physician immediately available to respond to emergencies See telemetry face sheet for immediately available ER MD    Location [facility]-Cardiac & Pulmonary Rehab    Staff Present Birdie Sons, MPA, Mauricia Area, BS, ACSM CEP, Exercise Physiologist;Laureen Owens Shark, BS, RRT, CPFT;Jessica [place], MA, RCEP, CCRP, CCET    Virtual Visit No    Medication changes reported     No    Fall or balance concerns reported    No    Tobacco Cessation No Change    Warm-up and Cool-down Performed on first and last piece of equipment    Resistance Training Performed Yes    VAD Patient? No    PAD/SET Patient? No      Pain Assessment   Currently in Pain? No/denies              Social History   Tobacco Use  Smoking Status Former Smoker  . Packs/day: 3.00  . Years: 35.00  . Pack years: 105.00  . Types: Cigarettes  . Quit date: 04/09/2002  . Years since quitting: 18.2  Smokeless Tobacco Former Systems developer  . Types: Snuff, Chew  . Quit date: 03/09/1997  Tobacco Comment   patient has not smoked since 2004    Goals Met:  Independence with exercise equipment Exercise tolerated well No report of cardiac concerns or symptoms Strength training completed today  Goals Unmet:  Not Applicable  Comments: Pt able to follow exercise prescription today without complaint.  Will continue to monitor for progression.    Dr. Emily Filbert is Medical Director for [place] and [facility].

## 2020-07-24 ENCOUNTER — Encounter: Payer: Self-pay | Admitting: *Deleted

## 2020-07-24 ENCOUNTER — Other Ambulatory Visit: Payer: Self-pay

## 2020-07-24 DIAGNOSIS — J439 Emphysema, unspecified: Secondary | ICD-10-CM

## 2020-07-24 NOTE — Progress Notes (Signed)
Pulmonary Individual Treatment Plan  Patient Details  Name: REZA CRYMES MRN: [id number] Date of Birth: September 25, 1950 Referring Provider:   Flowsheet Row Pulmonary Rehab from 06/27/2020 in [facility] Cardiac and Pulmonary Rehab  Referring Provider [name]      Initial Encounter Date:  Flowsheet Row Pulmonary Rehab from 06/27/2020 in [facility] Cardiac and Pulmonary Rehab  Date 06/27/20      Visit Diagnosis: Pulmonary emphysema, unspecified emphysema type ([place])  Patient's Home Medications on Admission:  Current Outpatient Medications:  .  acetaminophen (TYLENOL) 325 MG tablet, TAKE TWO TABLETS BY MOUTH AS NEEDED, Disp: , Rfl:  .  albuterol (PROVENTIL) (2.5 MG/3ML) 0.083% nebulizer solution, Take 3 mLs (2.5 mg total) by nebulization every 6 (six) hours as needed for wheezing or shortness of breath., Disp: 75 mL, Rfl: 1 .  albuterol (VENTOLIN HFA) 108 (90 Base) MCG/ACT inhaler, INHALE 2 PUFFS BY ORAL INHALATION EVERY 6 HOURS FOR BREATHING. BE SURE TO [place] MOUTHPIECE WITH WARM WATER ONCE A WEEK, Disp: , Rfl:  .  amitriptyline (ELAVIL) 10 MG tablet, Take 2 tablets by mouth at bedtime., Disp: , Rfl:  .  amitriptyline (ELAVIL) 100 MG tablet, Take 100 mg by mouth at bedtime. (Patient not taking: Reported on 06/18/2020), Disp: , Rfl:  .  aspirin 81 MG EC tablet, Take by mouth., Disp: , Rfl:  .  chlorpheniramine (CHLOR-TRIMETON) 4 MG tablet, Take 4 mg by mouth 2 (two) times daily as needed for allergies., Disp: , Rfl:  .  colchicine 0.6 MG tablet, TAKE TABLET(S) BY MOUTH AS DIRECTED : 2 TABLETS TO START, THEN ONE TABLET ONE HOUR LATER; MAY REPEAT IN 3 DAYS, Disp: , Rfl:  .  diclofenac sodium (VOLTAREN) 1 % GEL, Apply 2 g topically 4 (four) times daily., Disp: , Rfl:  .  diltiazem (CARDIZEM) 120 MG tablet, Take 120 mg by mouth 4 (four) times daily., Disp: , Rfl:  .  diltiazem (TIAZAC) 120 MG 24 hr capsule, Take by mouth., Disp: , Rfl:  .  ergocalciferol (VITAMIN D2) 1.25 MG (50000 UT) capsule, Take by mouth.,  Disp: , Rfl:  .  fluticasone (FLONASE) 50 MCG/ACT nasal spray, INSTILL 2 SPRAYS INTO EACH NOSTRIL EVERY DAY MAXIMUM 2 SPRAYS IN EACH NOSTRIL DAILY. FOR ALLERGIES TILT HEAD FORWARD, AIM AS DIRECTED, Disp: , Rfl:  .  lisinopril (ZESTRIL) 10 MG tablet, Take 10 mg by mouth daily. (Patient not taking: Reported on 06/18/2020), Disp: , Rfl:  .  lisinopril (ZESTRIL) 20 MG tablet, TAKE ONE-HALF TABLET BY MOUTH EVERY DAY FOR BLOOD PRESSURE, Disp: , Rfl:  .  loratadine (CLARITIN) 10 MG tablet, Take 1 tablet by mouth daily., Disp: , Rfl:  .  methocarbamol (ROBAXIN) 750 MG tablet, Take 750 mg by mouth 4 (four) times daily., Disp: , Rfl:  .  mometasone (ASMANEX) 220 MCG/INH inhaler, Inhale 2 puffs into the lungs daily., Disp: , Rfl:  .  montelukast (SINGULAIR) 10 MG tablet, TAKE 1 TABLET(10 MG) BY MOUTH EVERY NIGHT, Disp: , Rfl:  .  Multiple Vitamin (MULTI-VITAMIN) tablet, Take 1 tablet by mouth daily., Disp: , Rfl:  .  Multiple Vitamins-Minerals (MULTIVITAMIN ADULT EXTRA C PO), TAKE 1 TABLET BY MOUTH ONCE EVERY DAY FOR USE AS A VITAMIN SUPPLEMENT (Patient not taking: Reported on 06/18/2020), Disp: , Rfl:  .  omeprazole (PRILOSEC OTC) 20 MG tablet, Take by mouth. (Patient not taking: Reported on 06/18/2020), Disp: , Rfl:  .  omeprazole (PRILOSEC) 20 MG capsule, Take 20 mg by mouth daily. (Patient not taking: Reported  on 06/18/2020), Disp: , Rfl:  .  omeprazole (PRILOSEC) 40 MG capsule, TAKE ONE CAPSULE BY MOUTH TWICE DAILY BEFORE MORNING AND EVENING MEAL TO CONTROL STOMACH ACID.  TAKE 30 MINUTES BEFORE A MEAL, Disp: , Rfl:  .  ondansetron (ZOFRAN ODT) 4 MG disintegrating tablet, Take 1 tablet (4 mg total) by mouth every 8 (eight) hours as needed for nausea or vomiting., Disp: 10 tablet, Rfl: 0 .  predniSONE (DELTASONE) 10 MG tablet, Take 1 tablet (10 mg total) by mouth daily. Day 1-3: take 4 tablets PO daily Day 4-6: take 3 tablets PO daily Day 7-9: take 2 tablets PO daily Day 10-12: take 1 tablet PO daily (Patient not  taking: Reported on 06/18/2020), Disp: 30 tablet, Rfl: 0 .  simvastatin (ZOCOR) 20 MG tablet, Take 20 mg by mouth daily. (Patient not taking: Reported on 06/18/2020), Disp: , Rfl:  .  simvastatin (ZOCOR) 20 MG tablet, TAKE ONE-HALF TABLET BY MOUTH AT BEDTIME FOR CHOLESTEROL, Disp: , Rfl:  .  terazosin (HYTRIN) 10 MG capsule, Take 10 mg by mouth at bedtime. (Patient not taking: Reported on 06/18/2020), Disp: , Rfl:  .  terazosin (HYTRIN) 5 MG capsule, Take 1 capsule by mouth at bedtime., Disp: , Rfl:  .  Tiotropium Bromide-Olodaterol 2.5-2.5 MCG/ACT AERS, Inhale into the lungs once. (Patient not taking: Reported on 06/18/2020), Disp: , Rfl:  .  Tiotropium Bromide-Olodaterol 2.5-2.5 MCG/ACT AERS, Inhale into the lungs., Disp: , Rfl:  .  topiramate (TOPAMAX) 100 MG tablet, Take 100 mg by mouth 2 (two) times daily., Disp: , Rfl:   Past Medical History: Past Medical History:  Diagnosis Date  . Atrial fibrillation (HCC)   . Cancer of kidney (HCC)   . Emphysema lung (HCC)   . Hypertension     Tobacco Use: Social History   Tobacco Use  Smoking Status Former Smoker  . Packs/day: 3.00  . Years: 35.00  . Pack years: 105.00  . Types: Cigarettes  . Quit date: 04/09/2002  . Years since quitting: 18.3  Smokeless Tobacco Former Neurosurgeon  . Types: Snuff, Chew  . Quit date: 03/09/1997  Tobacco Comment   patient has not smoked since 2004    Labs: Recent Review Flowsheet Data   There is no flowsheet data to display.      Pulmonary Assessment Scores:  Pulmonary Assessment Scores    Row Name 06/27/20 1532         ADL UCSD   ADL Phase Entry     SOB Score total 20     Rest 0     Walk 1     Stairs 3     Bath 0     Dress 1     Shop 2           CAT Score   CAT Score 15            UCSD: Self-administered rating of dyspnea associated with activities of daily living (ADLs) 6-point scale (0 = "not at all" to 5 = "maximal or unable to do because of breathlessness")  Scoring Scores range from  0 to 120.  Minimally important difference is 5 units  CAT: CAT can identify the health impairment of COPD patients and is better correlated with disease progression.  CAT has a scoring range of zero to 40. The CAT score is classified into four groups of low (less than 10), medium (10 - 20), high (21-30) and very high (31-40) based on the impact level of disease  on health status. A CAT score over 10 suggests significant symptoms.  A worsening CAT score could be explained by an exacerbation, poor medication adherence, poor inhaler technique, or progression of COPD or comorbid conditions.  CAT MCID is 2 points  mMRC: mMRC (Modified Medical Research Council) Dyspnea Scale is used to assess the degree of baseline functional disability in patients of respiratory disease due to dyspnea. No minimal important difference is established. A decrease in score of 1 point or greater is considered a positive change.   Pulmonary Function Assessment:  Pulmonary Function Assessment - 06/18/20 0937      Breath   Shortness of Breath Yes;Limiting activity           Exercise Target Goals: Exercise Program Goal: Individual exercise prescription set using results from initial 6 min walk test and THRR while considering  patient's activity barriers and safety.   Exercise Prescription Goal: Initial exercise prescription builds to 30-45 minutes a day of aerobic activity, 2-3 days per week.  Home exercise guidelines will be given to patient during program as part of exercise prescription that the participant will acknowledge.  Education: Aerobic Exercise: - Group verbal and visual presentation on the components of exercise prescription. Introduces F.I.T.T principle from ACSM for exercise prescriptions.  Reviews F.I.T.T. principles of aerobic exercise including progression. Written material given at graduation.   Education: Resistance Exercise: - Group verbal and visual presentation on the components of exercise  prescription. Introduces F.I.T.T principle from ACSM for exercise prescriptions  Reviews F.I.T.T. principles of resistance exercise including progression. Written material given at graduation. Flowsheet Row Pulmonary Rehab from 07/03/2020 in [facility] Cardiac and Pulmonary Rehab  Date 07/03/20  Educator AS  Instruction Review Code 1- Verbalizes Understanding       Education: Exercise & Equipment Safety: - Individual verbal instruction and demonstration of equipment use and safety with use of the equipment. Flowsheet Row Pulmonary Rehab from 07/03/2020 in [facility] Cardiac and Pulmonary Rehab  Date 06/27/20  Educator AS  Instruction Review Code 1- Verbalizes Understanding      Education: Exercise Physiology & General Exercise Guidelines: - Group verbal and written instruction with models to review the exercise physiology of the cardiovascular system and associated critical values. Provides general exercise guidelines with specific guidelines to those with heart or lung disease.    Education: Flexibility, Balance, Mind/Body Relaxation: - Group verbal and visual presentation with interactive activity on the components of exercise prescription. Introduces F.I.T.T principle from ACSM for exercise prescriptions. Reviews F.I.T.T. principles of flexibility and balance exercise training including progression. Also discusses the mind body connection.  Reviews various relaxation techniques to help reduce and manage stress (i.e. Deep breathing, progressive muscle relaxation, and visualization). Balance handout provided to take home. Written material given at graduation.   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 HR 105 bpm     Max Ex. BP 138/76     2 Minute Post BP 112/60           Interval HR   1 Minute HR 101     2 Minute HR 100     3 Minute HR 104     4 Minute HR 100     5 Minute HR 88     6 Minute HR 105     2 Minute Post HR 82     Interval Heart Rate? Yes           Interval Oxygen   Interval Oxygen? Yes     Baseline Oxygen Saturation % 93 %     1 Minute Oxygen Saturation % 93 %     1 Minute Liters of Oxygen 0 L     2 Minute Oxygen Saturation % 91 %     2 Minute Liters of Oxygen 0 L     3 Minute Oxygen Saturation % 89 %     3 Minute Liters of Oxygen 0 L     4 Minute Oxygen Saturation % 91 %     4 Minute Liters of Oxygen 0 L     5 Minute Oxygen Saturation % 91 %     5 Minute Liters of Oxygen 0 L     6 Minute Oxygen Saturation % 91 %     6 Minute Liters of Oxygen 0 L     2 Minute Post Oxygen Saturation % 93 %     2 Minute Post Liters of Oxygen 0 L           Oxygen Initial Assessment:  Oxygen Initial Assessment - 06/18/20 0937      Home Oxygen   Home Oxygen Device None    Sleep Oxygen Prescription None    Home Exercise Oxygen Prescription None    Home Resting Oxygen Prescription None      Initial 6 min Walk   Oxygen Used None      Program Oxygen Prescription   Program Oxygen Prescription None      Intervention   Short Term Goals To learn and understand importance of monitoring SPO2 with pulse oximeter and demonstrate accurate use of the pulse oximeter.;To learn and understand importance of maintaining oxygen saturations>88%;To learn and demonstrate proper pursed lip breathing techniques or other breathing techniques.;To learn and demonstrate proper use of respiratory medications    Long  Term Goals Verbalizes importance of monitoring SPO2 with pulse oximeter and return  demonstration;Maintenance of O2 saturations>88%;Exhibits proper breathing techniques, such as pursed lip breathing or other method taught during program session;Compliance with respiratory medication;Demonstrates proper use of MDI's           Oxygen Re-Evaluation:  Oxygen Re-Evaluation    Row Name 07/01/20 1414             Program Oxygen Prescription   Program Oxygen Prescription None               Home Oxygen   Home Oxygen Device None       Sleep Oxygen Prescription None       Home Exercise Oxygen Prescription None       Home Resting Oxygen Prescription None  Goals/Expected Outcomes   Short Term Goals To learn and understand importance of monitoring SPO2 with pulse oximeter and demonstrate accurate use of the pulse oximeter.;To learn and understand importance of maintaining oxygen saturations>88%;To learn and demonstrate proper pursed lip breathing techniques or other breathing techniques.       Long  Term Goals Verbalizes importance of monitoring SPO2 with pulse oximeter and return demonstration;Maintenance of O2 saturations>88%;Exhibits proper breathing techniques, such as pursed lip breathing or other method taught during program session;Compliance with respiratory medication       Comments Reviewed PLB technique with pt.  Talked about how it works and it's importance in maintaining their exercise saturations.       Goals/Expected Outcomes Short: Become more profiecient at using PLB.   Long: Become independent at using PLB.              Oxygen Discharge (Final Oxygen Re-Evaluation):  Oxygen Re-Evaluation - 07/01/20 1414      Program Oxygen Prescription   Program Oxygen Prescription None      Home Oxygen   Home Oxygen Device None    Sleep Oxygen Prescription None    Home Exercise Oxygen Prescription None    Home Resting Oxygen Prescription None      Goals/Expected Outcomes   Short Term Goals To learn and understand importance of monitoring SPO2 with  pulse oximeter and demonstrate accurate use of the pulse oximeter.;To learn and understand importance of maintaining oxygen saturations>88%;To learn and demonstrate proper pursed lip breathing techniques or other breathing techniques.    Long  Term Goals Verbalizes importance of monitoring SPO2 with pulse oximeter and return demonstration;Maintenance of O2 saturations>88%;Exhibits proper breathing techniques, such as pursed lip breathing or other method taught during program session;Compliance with respiratory medication    Comments Reviewed PLB technique with pt.  Talked about how it works and it's importance in maintaining their exercise saturations.    Goals/Expected Outcomes Short: Become more profiecient at using PLB.   Long: Become independent at using PLB.           Initial Exercise Prescription:  Initial Exercise Prescription - 06/27/20 1500      Date of Initial Exercise RX and Referring Provider   Date 06/27/20    Referring Provider [name]      Treadmill   MPH 2.5    Grade 1    Minutes 15    METs 3.3      Recumbant Bike   Level 3    RPM 60    Watts 40    Minutes 15    METs 3.3      NuStep   Level 3    SPM 80    Minutes 15    METs 3.3      Recumbant Elliptical   Level 2    RPM 50    Minutes 15    METs 3.3      T5 Nustep   Level 2    SPM 80    Minutes 15    METs 3.3      Prescription Details   Frequency (times per week) 3    Duration Progress to 30 minutes of continuous aerobic without signs/symptoms of physical distress      Intensity   THRR 40-80% of Max Heartrate 118-141    Ratings of Perceived Exertion 11-15    Perceived Dyspnea 0-4      Resistance Training   Training Prescription Yes    Weight 3 lb  Reps 10-15           Perform Capillary Blood Glucose checks as needed.  Exercise Prescription Changes:  Exercise Prescription Changes    Row Name 06/27/20 1500 07/09/20 1200 07/23/20 1500         Response to Exercise   Blood  Pressure (Admit) 128/64 124/64 136/8     Blood Pressure (Exercise) 138/76 124/64 148/80     Blood Pressure (Exit) 112/60 120/68 122/64     Heart Rate (Admit) 94 bpm 77 bpm 92 bpm     Heart Rate (Exercise) 105 bpm 107 bpm 115 bpm     Heart Rate (Exit) 82 bpm 79 bpm 100 bpm     Oxygen Saturation (Admit) 93 % 93 % 90 %     Oxygen Saturation (Exercise) 89 % 92 % 92 %     Oxygen Saturation (Exit) 93 % 94 % 96 %     Rating of Perceived Exertion (Exercise) 9 13 13      Perceived Dyspnea (Exercise) 1 2 3      Symptoms SI joint pain 4/10 -- SOB     Comments -- second day --     Duration -- Progress to 30 minutes of  aerobic without signs/symptoms of physical distress Progress to 30 minutes of  aerobic without signs/symptoms of physical distress     Intensity -- THRR unchanged THRR unchanged           Progression   Progression -- Continue to progress workloads to maintain intensity without signs/symptoms of physical distress. Continue to progress workloads to maintain intensity without signs/symptoms of physical distress.     Average METs -- 3.55 3.66           Resistance Training   Training Prescription -- Yes Yes     Weight -- 3 lb 3 lb     Reps -- 10-15 10-15           Treadmill   MPH -- 3 3     Grade -- 1 2     Minutes -- 15 15     METs -- 3.71 4.12           NuStep   Level -- 3 3     Minutes -- 15 15     METs -- 3.4 3.2            Exercise Comments:  Exercise Comments    Row Name 07/01/20 1412           Exercise Comments First full day of exercise!  Patient was oriented to gym and equipment including functions, settings, policies, and procedures.  Patient's individual exercise prescription and treatment plan were reviewed.  All starting workloads were established based on the results of the 6 minute walk test done at initial orientation visit.  The plan for exercise progression was also introduced and progression will be customized based on patient's performance and goals.               Exercise Goals and Review:  Exercise Goals    Row Name 06/27/20 1541             Exercise Goals   Increase Physical Activity Yes       Intervention Provide advice, education, support and counseling about physical activity/exercise needs.;Develop an individualized exercise prescription for aerobic and resistive training based on initial evaluation findings, risk stratification, comorbidities and participant's personal goals.       Expected Outcomes Short Term: Attend rehab  on a regular basis to increase amount of physical activity.;Long Term: Add in home exercise to make exercise part of routine and to increase amount of physical activity.;Long Term: Exercising regularly at least 3-5 days a week.       Increase Strength and Stamina Yes       Intervention Provide advice, education, support and counseling about physical activity/exercise needs.;Develop an individualized exercise prescription for aerobic and resistive training based on initial evaluation findings, risk stratification, comorbidities and participant's personal goals.       Expected Outcomes Short Term: Increase workloads from initial exercise prescription for resistance, speed, and METs.;Short Term: Perform resistance training exercises routinely during rehab and add in resistance training at home;Long Term: Improve cardiorespiratory fitness, muscular endurance and strength as measured by increased METs and functional capacity (6MWT)       Able to understand and use rate of perceived exertion (RPE) scale Yes       Intervention Provide education and explanation on how to use RPE scale       Expected Outcomes Short Term: Able to use RPE daily in rehab to express subjective intensity level;Long Term:  Able to use RPE to guide intensity level when exercising independently       Able to understand and use Dyspnea scale Yes       Intervention Provide education and explanation on how to use Dyspnea scale       Expected  Outcomes Short Term: Able to use Dyspnea scale daily in rehab to express subjective sense of shortness of breath during exertion;Long Term: Able to use Dyspnea scale to guide intensity level when exercising independently       Knowledge and understanding of Target Heart Rate Range (THRR) Yes       Intervention Provide education and explanation of THRR including how the numbers were predicted and where they are located for reference       Expected Outcomes Short Term: Able to state/look up THRR;Short Term: Able to use daily as guideline for intensity in rehab;Long Term: Able to use THRR to govern intensity when exercising independently       Able to check pulse independently Yes       Intervention Provide education and demonstration on how to check pulse in carotid and radial arteries.;Review the importance of being able to check your own pulse for safety during independent exercise       Expected Outcomes Short Term: Able to explain why pulse checking is important during independent exercise;Long Term: Able to check pulse independently and accurately       Understanding of Exercise Prescription Yes       Intervention Provide education, explanation, and written materials on patient's individual exercise prescription       Expected Outcomes Short Term: Able to explain program exercise prescription;Long Term: Able to explain home exercise prescription to exercise independently              Exercise Goals Re-Evaluation :  Exercise Goals Re-Evaluation    [place] Name 07/01/20 1413 07/09/20 1254 07/23/20 1523         Exercise Goal Re-Evaluation   Exercise Goals Review Increase Physical Activity;Able to understand and use rate of perceived exertion (RPE) scale;Knowledge and understanding of Target Heart Rate Range (THRR);Understanding of Exercise Prescription;Increase Strength and Stamina;Able to understand and use Dyspnea scale;Able to check pulse independently Increase Physical Activity;Increase  Strength and Stamina Increase Physical Activity;Increase Strength and Stamina;Understanding of Exercise Prescription     Comments Reviewed  RPE and dyspnea scales, THR and program prescription with pt today.  Pt voiced understanding and was given a copy of goals to take home. Nazair has someSI joint/sciatica pain with exercise.  Staff reviewed some stretches that may help. Ka returned this week after being out sick for a week.  He was able to pick back up again. We will continue to monitor his progress.     Expected Outcomes Short: Use RPE daily to regulate intensity. Long: Follow program prescription in THR. Short: continue exercise as tolerated Long: build overall stamina Short: Continue to attend reguarly again Long: Continue to improve stamina.            Discharge Exercise Prescription (Final Exercise Prescription Changes):  Exercise Prescription Changes - 07/23/20 1500      Response to Exercise   Blood Pressure (Admit) 136/8    Blood Pressure (Exercise) 148/80    Blood Pressure (Exit) 122/64    Heart Rate (Admit) 92 bpm    Heart Rate (Exercise) 115 bpm    Heart Rate (Exit) 100 bpm    Oxygen Saturation (Admit) 90 %    Oxygen Saturation (Exercise) 92 %    Oxygen Saturation (Exit) 96 %    Rating of Perceived Exertion (Exercise) 13    Perceived Dyspnea (Exercise) 3    Symptoms SOB    Duration Progress to 30 minutes of  aerobic without signs/symptoms of physical distress    Intensity THRR unchanged      Progression   Progression Continue to progress workloads to maintain intensity without signs/symptoms of physical distress.    Average METs 3.66      Resistance Training   Training Prescription Yes    Weight 3 lb    Reps 10-15      Treadmill   MPH 3    Grade 2    Minutes 15    METs 4.12      NuStep   Level 3    Minutes 15    METs 3.2           Nutrition:  Target Goals: Understanding of nutrition guidelines, daily intake of sodium 1500mg , cholesterol 200mg ,  calories 30% from fat and 7% or less from saturated fats, daily to have 5 or more servings of fruits and vegetables.  Education: All About Nutrition: -Group instruction provided by verbal, written material, interactive activities, discussions, models, and posters to present general guidelines for heart healthy nutrition including fat, fiber, MyPlate, the role of sodium in heart healthy nutrition, utilization of the nutrition label, and utilization of this knowledge for meal planning. Follow up email sent as well. Written material given at graduation.   Biometrics:    Nutrition Therapy Plan and Nutrition Goals:  Nutrition Therapy & Goals - 07/03/20 1334      Personal Nutrition Goals   Comments He will normally eat 2x/day B: tsp of butter and brown sugar - rinsed out peaches on oatmeal L/D: [employer] or [employer] bowls with cheese, vegetables and meat or steak (no vegetables with steaks)- $35 steaks for $10 S: oatmeal cookie. He reports lowered appetite. Weight stable - about 205lbs. He reports not being able to sleep well right now and he is tired all the time. He reports being lower income.           Nutrition Assessments:  MEDIFICTS Score Key:  ?70 Need to make dietary changes   40-70 Heart Healthy Diet  ? 40 Therapeutic Level Cholesterol Diet  Flowsheet Row Pulmonary Rehab  from 06/27/2020 in [facility] Cardiac and Pulmonary Rehab  Picture Your Plate Total Score on Admission 31     Picture Your Plate Scores:  <01 Unhealthy dietary pattern with much room for improvement.  41-50 Dietary pattern unlikely to meet recommendations for good health and room for improvement.  51-60 More healthful dietary pattern, with some room for improvement.   >60 Healthy dietary pattern, although there may be some specific behaviors that could be improved.   Nutrition Goals Re-Evaluation:   Nutrition Goals Discharge (Final Nutrition Goals Re-Evaluation):   Psychosocial: Target Goals: Acknowledge  presence or absence of significant depression and/or stress, maximize coping skills, provide positive support system. Participant is able to verbalize types and ability to use techniques and skills needed for reducing stress and depression.   Education: Stress, Anxiety, and Depression - Group verbal and visual presentation to define topics covered.  Reviews how body is impacted by stress, anxiety, and depression.  Also discusses healthy ways to reduce stress and to treat/manage anxiety and depression.  Written material given at graduation.   Education: Sleep Hygiene -Provides group verbal and written instruction about how sleep can affect your health.  Define sleep hygiene, discuss sleep cycles and impact of sleep habits. Review good sleep hygiene tips.    Initial Review & Psychosocial Screening:  Initial Psych Review & Screening - 06/18/20 0941      Initial Review   Current issues with None Identified      Family Dynamics   Good Support System? Yes    Comments His wife and his ministers are a good support system for him.      Barriers   Psychosocial barriers to participate in program The patient should benefit from training in stress management and relaxation.      Screening Interventions   Interventions Encouraged to exercise;To provide support and resources with identified psychosocial needs;Provide feedback about the scores to participant    Expected Outcomes Short Term goal: Utilizing psychosocial counselor, staff and physician to assist with identification of specific Stressors or current issues interfering with healing process. Setting desired goal for each stressor or current issue identified.;Long Term Goal: Stressors or current issues are controlled or eliminated.;Short Term goal: Identification and review with participant of any Quality of Life or Depression concerns found by scoring the questionnaire.;Long Term goal: The participant improves quality of Life and PHQ9 Scores as  seen by post scores and/or verbalization of changes           Quality of Life Scores:  Scores of 19 and below usually indicate a poorer quality of life in these areas.  A difference of  2-3 points is a clinically meaningful difference.  A difference of 2-3 points in the total score of the Quality of Life Index has been associated with significant improvement in overall quality of life, self-image, physical symptoms, and general health in studies assessing change in quality of life.  PHQ-9: Recent Review Flowsheet Data    Depression screen [facility] 2/9 06/27/2020 08/02/2018 07/05/2018 11/17/2016   Decreased Interest 1 0 0 0   Down, Depressed, Hopeless 0 0 0 0   PHQ - 2 Score 1 0 0 0   Altered sleeping 2  - - 0   Tired, decreased energy 2 - - 1   Change in appetite 0 - - 0   Feeling bad or failure about yourself  0 - - 0   Trouble concentrating 0 - - 0   Moving slowly or fidgety/restless 2 - -  0   Suicidal thoughts 0 - - 0   PHQ-9 Score 7 - - 1   Difficult doing work/chores - - - Somewhat difficult     Interpretation of Total Score  Total Score Depression Severity:  1-4 = Minimal depression, 5-9 = Mild depression, 10-14 = Moderate depression, 15-19 = Moderately severe depression, 20-27 = Severe depression   Psychosocial Evaluation and Intervention:  Psychosocial Evaluation - 06/18/20 0942      Psychosocial Evaluation & Interventions   Interventions Encouraged to exercise with the program and follow exercise prescription;Relaxation education;Stress management education    Comments His wife and his ministers are a good support system for him.    Expected Outcomes Short: Exercise regularly to support mental health and notify staff of any changes. Long: maintain mental health and well being through teaching of rehab or prescribed medications independently.    Continue Psychosocial Services  Follow up required by staff           Psychosocial Re-Evaluation:   Psychosocial Discharge  (Final Psychosocial Re-Evaluation):   Education: Education Goals: Education classes will be provided on a weekly basis, covering required topics. Participant will state understanding/return demonstration of topics presented.  Learning Barriers/Preferences:  Learning Barriers/Preferences - 06/18/20 [id number]      Learning Barriers/Preferences   Learning Barriers None    Learning Preferences None           General Pulmonary Education Topics:  Infection Prevention: - Provides verbal and written material to individual with discussion of infection control including proper hand washing and proper equipment cleaning during exercise session. Flowsheet Row Pulmonary Rehab from 07/03/2020 in [facility] Cardiac and Pulmonary Rehab  Date 06/27/20  Educator AS  Instruction Review Code 1- Verbalizes Understanding      Falls Prevention: - Provides verbal and written material to individual with discussion of falls prevention and safety. Flowsheet Row Pulmonary Rehab from 07/03/2020 in [facility] Cardiac and Pulmonary Rehab  Date 06/27/20  Educator AS  Instruction Review Code 1- Verbalizes Understanding      Chronic Lung Disease Review: - Group verbal instruction with posters, models, PowerPoint presentations and videos,  to review new updates, new respiratory medications, new advancements in procedures and treatments. Providing information on websites and "800" numbers for continued self-education. Includes information about supplement oxygen, available portable oxygen systems, continuous and intermittent flow rates, oxygen safety, concentrators, and Medicare reimbursement for oxygen. Explanation of Pulmonary Drugs, including class, frequency, complications, importance of spacers, rinsing mouth after steroid MDI's, and proper cleaning methods for nebulizers. Review of basic lung anatomy and physiology related to function, structure, and complications of lung disease. Review of risk factors. Discussion about  methods for diagnosing sleep apnea and types of masks and machines for OSA. Includes a review of the use of types of environmental controls: home humidity, furnaces, filters, dust mite/pet prevention, HEPA vacuums. Discussion about weather changes, air quality and the benefits of nasal washing. Instruction on Warning signs, infection symptoms, calling MD promptly, preventive modes, and value of vaccinations. Review of effective airway clearance, coughing and/or vibration techniques. Emphasizing that all should Create an Action Plan. Written material given at graduation. Flowsheet Row Pulmonary Rehab from 12/09/2016 in [facility] Cardiac and Pulmonary Rehab  Date 12/09/16  Educator [facility]  Instruction Review Code 1- Verbalizes Understanding      AED/CPR: - Group verbal and written instruction with the use of models to demonstrate the basic use of the AED with the basic ABC's of resuscitation.    Anatomy and Cardiac  Procedures: - Group verbal and visual presentation and models provide information about basic cardiac anatomy and function. Reviews the testing methods done to diagnose heart disease and the outcomes of the test results. Describes the treatment choices: Medical Management, Angioplasty, or Coronary Bypass Surgery for treating various heart conditions including Myocardial Infarction, Angina, Valve Disease, and Cardiac Arrhythmias.  Written material given at graduation. Flowsheet Row Pulmonary Rehab from 07/03/2020 in [facility] Cardiac and Pulmonary Rehab  Date 07/03/20  Educator [facility]  Instruction Review Code 1- Verbalizes Understanding      Medication Safety: - Group verbal and visual instruction to review commonly prescribed medications for heart and lung disease. Reviews the medication, class of the drug, and side effects. Includes the steps to properly store meds and maintain the prescription regimen.  Written material given at graduation.   Other: -Provides group and verbal instruction on various  topics (see comments)   Knowledge Questionnaire Score:  Knowledge Questionnaire Score - 06/27/20 1534      Knowledge Questionnaire Score   Pre Score 15/18            Core Components/Risk Factors/Patient Goals at Admission:  Personal Goals and Risk Factors at Admission - 06/27/20 1539      Core Components/Risk Factors/Patient Goals on Admission    Weight Management Yes;Weight Loss    Intervention Weight Management: Develop a combined nutrition and exercise program designed to reach desired caloric intake, while maintaining appropriate intake of nutrient and fiber, sodium and fats, and appropriate energy expenditure required for the weight goal.;Weight Management: Provide education and appropriate resources to help participant work on and attain dietary goals.;Weight Management/Obesity: Establish reasonable short term and long term weight goals.    Expected Outcomes Long Term: Adherence to nutrition and physical activity/exercise program aimed toward attainment of established weight goal;Short Term: Continue to assess and modify interventions until short term weight is achieved;Weight Loss: Understanding of general recommendations for a balanced deficit meal plan, which promotes 1-2 lb weight loss per week and includes a negative energy balance of [phone] kcal/d;Understanding recommendations for meals to include 15-35% energy as protein, 25-35% energy from fat, 35-60% energy from carbohydrates, less than 200mg  of dietary cholesterol, 20-35 gm of total fiber daily;Understanding of distribution of calorie intake throughout the day with the consumption of 4-5 meals/snacks    Intervention Provide education, individualized exercise plan and daily activity instruction to help decrease symptoms of SOB with activities of daily living.    Expected Outcomes Short Term: Improve cardiorespiratory fitness to achieve a reduction of symptoms when performing ADLs;Long Term: Be able to perform more ADLs without  symptoms or delay the onset of symptoms    Hypertension Yes    Intervention Provide education on lifestyle modifcations including regular physical activity/exercise, weight management, moderate sodium restriction and increased consumption of fresh fruit, vegetables, and low fat dairy, alcohol moderation, and smoking cessation.;Monitor prescription use compliance.    Expected Outcomes Short Term: Continued assessment and intervention until BP is < 140/64mm HG in hypertensive participants. < 130/50mm HG in hypertensive participants with diabetes, heart failure or chronic kidney disease.;Long Term: Maintenance of blood pressure at goal levels.    Lipids Yes    Intervention Provide education and support for participant on nutrition & aerobic/resistive exercise along with prescribed medications to achieve LDL 70mg , HDL >40mg .    Expected Outcomes Short Term: Participant states understanding of desired cholesterol values and is compliant with medications prescribed. Participant is following exercise prescription and nutrition guidelines.;Long Term: Cholesterol controlled with medications  as prescribed, with individualized exercise RX and with personalized nutrition plan. Value goals: LDL < 70mg , HDL > 40 mg.           Education:Diabetes - Individual verbal and written instruction to review signs/symptoms of diabetes, desired ranges of glucose level fasting, after meals and with exercise. Acknowledge that pre and post exercise glucose checks will be done for 3 sessions at entry of program.   Know Your Numbers and Heart Failure: - Group verbal and visual instruction to discuss disease risk factors for cardiac and pulmonary disease and treatment options.  Reviews associated critical values for Overweight/Obesity, Hypertension, Cholesterol, and Diabetes.  Discusses basics of heart failure: signs/symptoms and treatments.  Introduces Heart Failure Zone chart for action plan for heart failure.  Written material  given at graduation.   Core Components/Risk Factors/Patient Goals Review:    Core Components/Risk Factors/Patient Goals at Discharge (Final Review):    ITP Comments:  ITP Comments    Row Name 06/18/20 1001 07/01/20 1411 07/24/20 0609       ITP Comments Virtual Visit completed. Patient informed on EP and RD appointment and 6 Minute walk test. Patient also informed of patient health questionnaires on My Chart. Patient Verbalizes understanding. Visit diagnosis can be found in [facility] under Media. Patients [facility] orders to be scanned. First full day of exercise!  Patient was oriented to gym and equipment including functions, settings, policies, and procedures.  Patient's individual exercise prescription and treatment plan were reviewed.  All starting workloads were established based on the results of the 6 minute walk test done at initial orientation visit.  The plan for exercise progression was also introduced and progression will be customized based on patient's performance and goals. 30 Day review completed. Medical Director ITP review done, changes made as directed, and signed approval by Medical Director.   only 2 visits in May            Comments:

## 2020-07-24 NOTE — Progress Notes (Signed)
Daily Session Note  Patient Details  Name: Duane Klein MRN: [id number] Date of Birth: 07-13-1950 Referring Provider:   Flowsheet Row Pulmonary Rehab from 06/27/2020 in [facility] Cardiac and Pulmonary Rehab  Referring Provider [name]      Encounter Date: 07/24/2020  Check In:  Session Check In - 07/24/20 1359      Check-In   Supervising physician immediately available to respond to emergencies See telemetry face sheet for immediately available ER MD    Location [facility]-Cardiac & Pulmonary Rehab    Staff Present Birdie Sons, MPA, RN;Meredith Sherryll Burger, RN Margurite Auerbach, MS Exercise Physiologist    Virtual Visit No    Medication changes reported     No    Fall or balance concerns reported    No    Tobacco Cessation No Change    Warm-up and Cool-down Performed on first and last piece of equipment    Resistance Training Performed Yes    VAD Patient? No    PAD/SET Patient? No      Pain Assessment   Currently in Pain? No/denies              Social History   Tobacco Use  Smoking Status Former Smoker  . Packs/day: 3.00  . Years: 35.00  . Pack years: 105.00  . Types: Cigarettes  . Quit date: 04/09/2002  . Years since quitting: 18.3  Smokeless Tobacco Former Systems developer  . Types: Snuff, Chew  . Quit date: 03/09/1997  Tobacco Comment   patient has not smoked since 2004    Goals Met:  Independence with exercise equipment Exercise tolerated well No report of cardiac concerns or symptoms Strength training completed today  Goals Unmet:  Not Applicable  Comments: Pt able to follow exercise prescription today without complaint.  Will continue to monitor for progression.    Dr. Emily Filbert is Medical Director for [facility] Pulmonary [facility].

## 2020-07-29 ENCOUNTER — Other Ambulatory Visit: Payer: Self-pay

## 2020-07-29 DIAGNOSIS — J439 Emphysema, unspecified: Secondary | ICD-10-CM | POA: Diagnosis not present

## 2020-07-29 NOTE — Progress Notes (Signed)
Daily Session Note  Patient Details  Name: Duane Klein MRN: [id number] Date of Birth: 05/01/1950 Referring Provider:   Flowsheet Row Pulmonary Rehab from 06/27/2020 in [facility] Cardiac and Pulmonary Rehab  Referring Provider [name]      Encounter Date: 07/29/2020  Check In:      Social History   Tobacco Use  Smoking Status Former Smoker  . Packs/day: 3.00  . Years: 35.00  . Pack years: 105.00  . Types: Cigarettes  . Quit date: 04/09/2002  . Years since quitting: 18.3  Smokeless Tobacco Former Systems developer  . Types: Snuff, Chew  . Quit date: 03/09/1997  Tobacco Comment   patient has not smoked since 2004    Goals Met:  Independence with exercise equipment Exercise tolerated well No report of cardiac concerns or symptoms Strength training completed today  Goals Unmet:  Not Applicable  Comments: Pt able to follow exercise prescription today without complaint.  Will continue to monitor for progression.    Dr. Emily Filbert is Medical Director for [place].  Dr. Ottie Glazier is Medical Director for [facility] Pulmonary Rehabilitation.

## 2020-07-31 ENCOUNTER — Other Ambulatory Visit: Payer: Self-pay

## 2020-07-31 DIAGNOSIS — J439 Emphysema, unspecified: Secondary | ICD-10-CM

## 2020-07-31 NOTE — Progress Notes (Signed)
Daily Session Note  Patient Details  Name: Duane Klein MRN: [id number] Date of Birth: December 03, 1950 Referring Provider:   Flowsheet Row Pulmonary Rehab from 06/27/2020 in [facility] Cardiac and Pulmonary Rehab  Referring Provider [name]      Encounter Date: 07/31/2020  Check In:  Session Check In - 07/31/20 1410      Check-In   Supervising physician immediately available to respond to emergencies See telemetry face sheet for immediately available ER MD    Location [facility]-Cardiac & Pulmonary Rehab    Staff Present Birdie Sons, MPA, RN;Joseph Lou Miner, MS, ASCM CEP, Exercise Physiologist    Virtual Visit No    Medication changes reported     No    Fall or balance concerns reported    No    Tobacco Cessation No Change    Warm-up and Cool-down Performed on first and last piece of equipment    Resistance Training Performed Yes    VAD Patient? No    PAD/SET Patient? No      Pain Assessment   Currently in Pain? No/denies              Social History   Tobacco Use  Smoking Status Former Smoker  . Packs/day: 3.00  . Years: 35.00  . Pack years: 105.00  . Types: Cigarettes  . Quit date: 04/09/2002  . Years since quitting: 18.3  Smokeless Tobacco Former Systems developer  . Types: Snuff, Chew  . Quit date: 03/09/1997  Tobacco Comment   patient has not smoked since 2004    Goals Met:  Independence with exercise equipment Exercise tolerated well No report of cardiac concerns or symptoms Strength training completed today  Goals Unmet:  Not Applicable  Comments: Pt able to follow exercise prescription today without complaint.  Will continue to monitor for progression.    Dr. Emily Filbert is Medical Director for [place].  Dr. Ottie Glazier is Medical Director for [facility] Pulmonary Rehabilitation.

## 2020-08-14 ENCOUNTER — Other Ambulatory Visit: Payer: Self-pay

## 2020-08-14 ENCOUNTER — Encounter: Payer: No Typology Code available for payment source | Attending: Pediatrics

## 2020-08-14 DIAGNOSIS — J439 Emphysema, unspecified: Secondary | ICD-10-CM | POA: Diagnosis present

## 2020-08-14 NOTE — Progress Notes (Signed)
Daily Session Note  Patient Details  Name: Duane Klein MRN: [id number] Date of Birth: 1950-09-14 Referring Provider:   Flowsheet Row Pulmonary Rehab from 06/27/2020 in [facility] Cardiac and Pulmonary Rehab  Referring Provider [name]      Encounter Date: 08/14/2020  Check In:  Session Check In - 08/14/20 1407      Check-In   Supervising physician immediately available to respond to emergencies See telemetry face sheet for immediately available ER MD    Location [facility]-Cardiac & Pulmonary Rehab    Staff Present Birdie Sons, MPA, RN;Joseph Lou Miner, MS, ASCM CEP, Exercise Physiologist    Virtual Visit No    Medication changes reported     No    Fall or balance concerns reported    No    Tobacco Cessation No Change    Warm-up and Cool-down Performed on first and last piece of equipment    Resistance Training Performed Yes    VAD Patient? No    PAD/SET Patient? No      Pain Assessment   Currently in Pain? No/denies              Social History   Tobacco Use  Smoking Status Former Smoker  . Packs/day: 3.00  . Years: 35.00  . Pack years: 105.00  . Types: Cigarettes  . Quit date: 04/09/2002  . Years since quitting: 18.3  Smokeless Tobacco Former Systems developer  . Types: Snuff, Chew  . Quit date: 03/09/1997  Tobacco Comment   patient has not smoked since 2004    Goals Met:  Independence with exercise equipment Exercise tolerated well No report of cardiac concerns or symptoms Strength training completed today  Goals Unmet:  Not Applicable  Comments: Pt able to follow exercise prescription today without complaint.  Will continue to monitor for progression.    Dr. Emily Filbert is Medical Director for [place].  Dr. Ottie Glazier is Medical Director for [facility] Pulmonary Rehabilitation.

## 2020-08-15 NOTE — Progress Notes (Unsigned)
Pulmonary Individual Treatment Plan  Patient Details  Name: Duane Klein MRN: [id number] Date of Birth: 1950/08/03 Referring Provider:   Flowsheet Row Pulmonary Rehab from 06/27/2020 in [facility] Cardiac and Pulmonary Rehab  Referring Provider [name]       Initial Encounter Date:  Flowsheet Row Pulmonary Rehab from 06/27/2020 in [facility] Cardiac and Pulmonary Rehab  Date 06/27/20       Visit Diagnosis: No diagnosis found.  Patient's Home Medications on Admission:  Current Outpatient Medications:    acetaminophen (TYLENOL) 325 MG tablet, TAKE TWO TABLETS BY MOUTH AS NEEDED, Disp: , Rfl:    albuterol (PROVENTIL) (2.5 MG/3ML) 0.083% nebulizer solution, Take 3 mLs (2.5 mg total) by nebulization every 6 (six) hours as needed for wheezing or shortness of breath., Disp: 75 mL, Rfl: 1   albuterol (VENTOLIN HFA) 108 (90 Base) MCG/ACT inhaler, INHALE 2 PUFFS BY ORAL INHALATION EVERY 6 HOURS FOR BREATHING. BE SURE TO [place] MOUTHPIECE WITH WARM WATER ONCE A WEEK, Disp: , Rfl:    amitriptyline (ELAVIL) 10 MG tablet, Take 2 tablets by mouth at bedtime., Disp: , Rfl:    amitriptyline (ELAVIL) 100 MG tablet, Take 100 mg by mouth at bedtime. (Patient not taking: Reported on 06/18/2020), Disp: , Rfl:    aspirin 81 MG EC tablet, Take by mouth., Disp: , Rfl:    chlorpheniramine (CHLOR-TRIMETON) 4 MG tablet, Take 4 mg by mouth 2 (two) times daily as needed for allergies., Disp: , Rfl:    colchicine 0.6 MG tablet, TAKE TABLET(S) BY MOUTH AS DIRECTED : 2 TABLETS TO START, THEN ONE TABLET ONE HOUR LATER; MAY REPEAT IN 3 DAYS, Disp: , Rfl:    diclofenac sodium (VOLTAREN) 1 % GEL, Apply 2 g topically 4 (four) times daily., Disp: , Rfl:    diltiazem (CARDIZEM) 120 MG tablet, Take 120 mg by mouth 4 (four) times daily., Disp: , Rfl:    diltiazem (TIAZAC) 120 MG 24 hr capsule, Take by mouth., Disp: , Rfl:    ergocalciferol (VITAMIN D2) 1.25 MG (50000 UT) capsule, Take by mouth., Disp: , Rfl:    fluticasone (FLONASE) 50  MCG/ACT nasal spray, INSTILL 2 SPRAYS INTO EACH NOSTRIL EVERY DAY MAXIMUM 2 SPRAYS IN EACH NOSTRIL DAILY. FOR ALLERGIES TILT HEAD FORWARD, AIM AS DIRECTED, Disp: , Rfl:    lisinopril (ZESTRIL) 10 MG tablet, Take 10 mg by mouth daily. (Patient not taking: Reported on 06/18/2020), Disp: , Rfl:    lisinopril (ZESTRIL) 20 MG tablet, TAKE ONE-HALF TABLET BY MOUTH EVERY DAY FOR BLOOD PRESSURE, Disp: , Rfl:    loratadine (CLARITIN) 10 MG tablet, Take 1 tablet by mouth daily., Disp: , Rfl:    methocarbamol (ROBAXIN) 750 MG tablet, Take 750 mg by mouth 4 (four) times daily., Disp: , Rfl:    mometasone (ASMANEX) 220 MCG/INH inhaler, Inhale 2 puffs into the lungs daily., Disp: , Rfl:    montelukast (SINGULAIR) 10 MG tablet, TAKE 1 TABLET(10 MG) BY MOUTH EVERY NIGHT, Disp: , Rfl:    Multiple Vitamin (MULTI-VITAMIN) tablet, Take 1 tablet by mouth daily., Disp: , Rfl:    Multiple Vitamins-Minerals (MULTIVITAMIN ADULT EXTRA C PO), TAKE 1 TABLET BY MOUTH ONCE EVERY DAY FOR USE AS A VITAMIN SUPPLEMENT (Patient not taking: Reported on 06/18/2020), Disp: , Rfl:    omeprazole (PRILOSEC OTC) 20 MG tablet, Take by mouth. (Patient not taking: Reported on 06/18/2020), Disp: , Rfl:    omeprazole (PRILOSEC) 20 MG capsule, Take 20 mg by mouth daily. (Patient not taking: Reported on  06/18/2020), Disp: , Rfl:    omeprazole (PRILOSEC) 40 MG capsule, TAKE ONE CAPSULE BY MOUTH TWICE DAILY BEFORE MORNING AND EVENING MEAL TO CONTROL STOMACH ACID.  TAKE 30 MINUTES BEFORE A MEAL, Disp: , Rfl:    ondansetron (ZOFRAN ODT) 4 MG disintegrating tablet, Take 1 tablet (4 mg total) by mouth every 8 (eight) hours as needed for nausea or vomiting., Disp: 10 tablet, Rfl: 0   predniSONE (DELTASONE) 10 MG tablet, Take 1 tablet (10 mg total) by mouth daily. Day 1-3: take 4 tablets PO daily Day 4-6: take 3 tablets PO daily Day 7-9: take 2 tablets PO daily Day 10-12: take 1 tablet PO daily (Patient not taking: Reported on 06/18/2020), Disp: 30 tablet, Rfl:  0   simvastatin (ZOCOR) 20 MG tablet, Take 20 mg by mouth daily. (Patient not taking: Reported on 06/18/2020), Disp: , Rfl:    simvastatin (ZOCOR) 20 MG tablet, TAKE ONE-HALF TABLET BY MOUTH AT BEDTIME FOR CHOLESTEROL, Disp: , Rfl:    terazosin (HYTRIN) 10 MG capsule, Take 10 mg by mouth at bedtime. (Patient not taking: Reported on 06/18/2020), Disp: , Rfl:    terazosin (HYTRIN) 5 MG capsule, Take 1 capsule by mouth at bedtime., Disp: , Rfl:    Tiotropium Bromide-Olodaterol 2.5-2.5 MCG/ACT AERS, Inhale into the lungs once. (Patient not taking: Reported on 06/18/2020), Disp: , Rfl:    Tiotropium Bromide-Olodaterol 2.5-2.5 MCG/ACT AERS, Inhale into the lungs., Disp: , Rfl:    topiramate (TOPAMAX) 100 MG tablet, Take 100 mg by mouth 2 (two) times daily., Disp: , Rfl:   Past Medical History: Past Medical History:  Diagnosis Date   Atrial fibrillation ([place])    Cancer of kidney ([place])    Emphysema lung ([place])    Hypertension     Tobacco Use: Social History   Tobacco Use  Smoking Status Former   Packs/day: 3.00   Years: 35.00   Pack years: 105.00   Types: Cigarettes   Quit date: 04/09/2002   Years since quitting: 18.3  Smokeless Tobacco Former   Types: Snuff, Chew   Quit date: 03/09/1997  Tobacco Comments   patient has not smoked since 2004    Labs: Recent Review Flowsheet Data   There is no flowsheet data to display.      Pulmonary Assessment Scores:  Pulmonary Assessment Scores     Row Name 06/27/20 1532         ADL UCSD   ADL Phase Entry     SOB Score total 20     Rest 0     Walk 1     Stairs 3     Bath 0     Dress 1     Shop 2           CAT Score     CAT Score 15             UCSD: Self-administered rating of dyspnea associated with activities of daily living (ADLs) 6-point scale (0 = "not at all" to 5 = "maximal or unable to do because of breathlessness")  Scoring Scores range from 0 to 120.  Minimally important difference is 5 units  CAT: CAT can  identify the health impairment of COPD patients and is better correlated with disease progression.  CAT has a scoring range of zero to 40. The CAT score is classified into four groups of low (less than 10), medium (10 - 20), high (21-30) and very high (31-40) based on the impact level of  disease on health status. A CAT score over 10 suggests significant symptoms.  A worsening CAT score could be explained by an exacerbation, poor medication adherence, poor inhaler technique, or progression of COPD or comorbid conditions.  CAT MCID is 2 points  mMRC: mMRC (Modified Medical Research Council) Dyspnea Scale is used to assess the degree of baseline functional disability in patients of respiratory disease due to dyspnea. No minimal important difference is established. A decrease in score of 1 point or greater is considered a positive change.   Pulmonary Function Assessment:  Pulmonary Function Assessment - 06/18/20 0937       Breath   Shortness of Breath Yes;Limiting activity             Exercise Target Goals: Exercise Program Goal: Individual exercise prescription set using results from initial 6 min walk test and THRR while considering  patient's activity barriers and safety.   Exercise Prescription Goal: Initial exercise prescription builds to 30-45 minutes a day of aerobic activity, 2-3 days per week.  Home exercise guidelines will be given to patient during program as part of exercise prescription that the participant will acknowledge.  Education: Aerobic Exercise: - Group verbal and visual presentation on the components of exercise prescription. Introduces F.I.T.T principle from ACSM for exercise prescriptions.  Reviews F.I.T.T. principles of aerobic exercise including progression. Written material given at graduation.   Education: Resistance Exercise: - Group verbal and visual presentation on the components of exercise prescription. Introduces F.I.T.T principle from ACSM for exercise  prescriptions  Reviews F.I.T.T. principles of resistance exercise including progression. Written material given at graduation. Flowsheet Row Pulmonary Rehab from 08/14/2020 in [facility] Cardiac and Pulmonary Rehab  Date 07/03/20  Educator AS  Instruction Review Code 1- Verbalizes Understanding        Education: Exercise & Equipment Safety: - Individual verbal instruction and demonstration of equipment use and safety with use of the equipment. Flowsheet Row Pulmonary Rehab from 08/14/2020 in [facility] Cardiac and Pulmonary Rehab  Date 06/27/20  Educator AS  Instruction Review Code 1- Verbalizes Understanding       Education: Exercise Physiology & General Exercise Guidelines: - Group verbal and written instruction with models to review the exercise physiology of the cardiovascular system and associated critical values. Provides general exercise guidelines with specific guidelines to those with heart or lung disease.    Education: Flexibility, Balance, Mind/Body Relaxation: - Group verbal and visual presentation with interactive activity on the components of exercise prescription. Introduces F.I.T.T principle from ACSM for exercise prescriptions. Reviews F.I.T.T. principles of flexibility and balance exercise training including progression. Also discusses the mind body connection.  Reviews various relaxation techniques to help reduce and manage stress (i.e. Deep breathing, progressive muscle relaxation, and visualization). Balance handout provided to take home. Written material given at graduation.   Activity Barriers & Risk Stratification:   6 Minute Walk:  6 Minute Walk     Row Name 06/27/20 1519         6 Minute Walk   Phase Initial     Distance 1300 feet     Walk Time 6 minutes     # of Rest Breaks 0     MPH 2.46     METS 3.32     RPE 9     Perceived Dyspnea  1     VO2 Peak 11.65     Symptoms Yes (comment)     Comments Si joint pain 4/10 (chronic)     Resting HR 94 bpm  Resting BP 128/64     Resting Oxygen Saturation  93 %     Exercise Oxygen Saturation  during 6 min walk 89 %     Max Ex. HR 105 bpm     Max Ex. BP 138/76     2 Minute Post BP 112/60           Interval HR     1 Minute HR 101     2 Minute HR 100     3 Minute HR 104     4 Minute HR 100     5 Minute HR 88     6 Minute HR 105     2 Minute Post HR 82     Interval Heart Rate? Yes           Interval Oxygen     Interval Oxygen? Yes     Baseline Oxygen Saturation % 93 %     1 Minute Oxygen Saturation % 93 %     1 Minute Liters of Oxygen 0 L     2 Minute Oxygen Saturation % 91 %     2 Minute Liters of Oxygen 0 L     3 Minute Oxygen Saturation % 89 %     3 Minute Liters of Oxygen 0 L     4 Minute Oxygen Saturation % 91 %     4 Minute Liters of Oxygen 0 L     5 Minute Oxygen Saturation % 91 %     5 Minute Liters of Oxygen 0 L     6 Minute Oxygen Saturation % 91 %     6 Minute Liters of Oxygen 0 L     2 Minute Post Oxygen Saturation % 93 %     2 Minute Post Liters of Oxygen 0 L            Oxygen Initial Assessment:  Oxygen Initial Assessment - 06/18/20 0937       Home Oxygen   Home Oxygen Device None    Sleep Oxygen Prescription None    Home Exercise Oxygen Prescription None    Home Resting Oxygen Prescription None      Initial 6 min Walk   Oxygen Used None      Program Oxygen Prescription   Program Oxygen Prescription None      Intervention   Short Term Goals To learn and understand importance of monitoring SPO2 with pulse oximeter and demonstrate accurate use of the pulse oximeter.;To learn and understand importance of maintaining oxygen saturations>88%;To learn and demonstrate proper pursed lip breathing techniques or other breathing techniques. ;To learn and demonstrate proper use of respiratory medications    Long  Term Goals Verbalizes importance of monitoring SPO2 with pulse oximeter and return demonstration;Maintenance of O2 saturations>88%;Exhibits proper  breathing techniques, such as pursed lip breathing or other method taught during program session;Compliance with respiratory medication;Demonstrates proper use of MDI's             Oxygen Re-Evaluation:  Oxygen Re-Evaluation     Row Name 07/01/20 1414             Program Oxygen Prescription   Program Oxygen Prescription None               Home Oxygen     Home Oxygen Device None       Sleep Oxygen Prescription None       Home Exercise Oxygen Prescription None  Home Resting Oxygen Prescription None               Goals/Expected Outcomes     Short Term Goals To learn and understand importance of monitoring SPO2 with pulse oximeter and demonstrate accurate use of the pulse oximeter.;To learn and understand importance of maintaining oxygen saturations>88%;To learn and demonstrate proper pursed lip breathing techniques or other breathing techniques.        Long  Term Goals Verbalizes importance of monitoring SPO2 with pulse oximeter and return demonstration;Maintenance of O2 saturations>88%;Exhibits proper breathing techniques, such as pursed lip breathing or other method taught during program session;Compliance with respiratory medication       Comments Reviewed PLB technique with pt.  Talked about how it works and it's importance in maintaining their exercise saturations.       Goals/Expected Outcomes Short: Become more profiecient at using PLB.   Long: Become independent at using PLB.               Oxygen Discharge (Final Oxygen Re-Evaluation):  Oxygen Re-Evaluation - 07/01/20 1414       Program Oxygen Prescription   Program Oxygen Prescription None      Home Oxygen   Home Oxygen Device None    Sleep Oxygen Prescription None    Home Exercise Oxygen Prescription None    Home Resting Oxygen Prescription None      Goals/Expected Outcomes   Short Term Goals To learn and understand importance of monitoring SPO2 with pulse oximeter and demonstrate accurate use of the  pulse oximeter.;To learn and understand importance of maintaining oxygen saturations>88%;To learn and demonstrate proper pursed lip breathing techniques or other breathing techniques.     Long  Term Goals Verbalizes importance of monitoring SPO2 with pulse oximeter and return demonstration;Maintenance of O2 saturations>88%;Exhibits proper breathing techniques, such as pursed lip breathing or other method taught during program session;Compliance with respiratory medication    Comments Reviewed PLB technique with pt.  Talked about how it works and it's importance in maintaining their exercise saturations.    Goals/Expected Outcomes Short: Become more profiecient at using PLB.   Long: Become independent at using PLB.             Initial Exercise Prescription:  Initial Exercise Prescription - 06/27/20 1500       Date of Initial Exercise RX and Referring Provider   Date 06/27/20    Referring Provider [name]      Treadmill   MPH 2.5    Grade 1    Minutes 15    METs 3.3      Recumbant Bike   Level 3    RPM 60    Watts 40    Minutes 15    METs 3.3      NuStep   Level 3    SPM 80    Minutes 15    METs 3.3      Recumbant Elliptical   Level 2    RPM 50    Minutes 15    METs 3.3      T5 Nustep   Level 2    SPM 80    Minutes 15    METs 3.3      Prescription Details   Frequency (times per week) 3    Duration Progress to 30 minutes of continuous aerobic without signs/symptoms of physical distress      Intensity   THRR 40-80% of Max Heartrate 118-141    Ratings of Perceived Exertion  11-15    Perceived Dyspnea 0-4      Resistance Training   Training Prescription Yes    Weight 3 lb    Reps 10-15             Perform Capillary Blood Glucose checks as needed.  Exercise Prescription Changes:   Exercise Prescription Changes     Row Name 06/27/20 1500 07/09/20 1200 07/23/20 1500 08/06/20 1300       Response to Exercise   Blood Pressure (Admit) 128/64 124/64  136/8 110/70    Blood Pressure (Exercise) 138/76 124/64 148/80 150/76    Blood Pressure (Exit) 112/60 120/68 122/64 120/72    Heart Rate (Admit) 94 bpm 77 bpm 92 bpm 85 bpm    Heart Rate (Exercise) 105 bpm 107 bpm 115 bpm 104 bpm    Heart Rate (Exit) 82 bpm 79 bpm 100 bpm 100 bpm    Oxygen Saturation (Admit) 93 % 93 % 90 % 94 %    Oxygen Saturation (Exercise) 89 % 92 % 92 % 92 %    Oxygen Saturation (Exit) 93 % 94 % 96 % 94 %    Rating of Perceived Exertion (Exercise) 9 13 13 12     Perceived Dyspnea (Exercise) 1 2 3  --    Symptoms SI joint pain 4/10 -- SOB --    Comments -- second day -- --    Duration -- Progress to 30 minutes of  aerobic without signs/symptoms of physical distress Progress to 30 minutes of  aerobic without signs/symptoms of physical distress Continue with 30 min of aerobic exercise without signs/symptoms of physical distress.    Intensity -- THRR unchanged THRR unchanged THRR unchanged         Progression        Progression -- Continue to progress workloads to maintain intensity without signs/symptoms of physical distress. Continue to progress workloads to maintain intensity without signs/symptoms of physical distress. Continue to progress workloads to maintain intensity without signs/symptoms of physical distress.    Average METs -- 3.55 3.66 3.75         Resistance Training        Training Prescription -- Yes Yes Yes    Weight -- 3 lb 3 lb 3 lb    Reps -- 10-15 10-15 10-15         Treadmill        MPH -- 3 3 --    Grade -- 1 2 --    Minutes -- 15 15 --    METs -- 3.71 4.12 --         NuStep        Level -- 3 3 3     Minutes -- 15 15 15     METs -- 3.4 3.2 3         Track        Laps -- -- -- 51    Minutes -- -- -- 15    METs -- -- -- 4.5            Exercise Comments:   Exercise Comments     Row Name 07/01/20 1412           Exercise Comments First full day of exercise!  Patient was oriented to gym and equipment including functions,  settings, policies, and procedures.  Patient's individual exercise prescription and treatment plan were reviewed.  All starting workloads were established based on the results of the 6 minute walk test done at initial orientation  visit.  The plan for exercise progression was also introduced and progression will be customized based on patient's performance and goals.                Exercise Goals and Review:   Exercise Goals     Row Name 06/27/20 1541             Exercise Goals   Increase Physical Activity Yes       Intervention Provide advice, education, support and counseling about physical activity/exercise needs.;Develop an individualized exercise prescription for aerobic and resistive training based on initial evaluation findings, risk stratification, comorbidities and participant's personal goals.       Expected Outcomes Short Term: Attend rehab on a regular basis to increase amount of physical activity.;Long Term: Add in home exercise to make exercise part of routine and to increase amount of physical activity.;Long Term: Exercising regularly at least 3-5 days a week.       Increase Strength and Stamina Yes       Intervention Provide advice, education, support and counseling about physical activity/exercise needs.;Develop an individualized exercise prescription for aerobic and resistive training based on initial evaluation findings, risk stratification, comorbidities and participant's personal goals.       Expected Outcomes Short Term: Increase workloads from initial exercise prescription for resistance, speed, and METs.;Short Term: Perform resistance training exercises routinely during rehab and add in resistance training at home;Long Term: Improve cardiorespiratory fitness, muscular endurance and strength as measured by increased METs and functional capacity (6MWT)       Able to understand and use rate of perceived exertion (RPE) scale Yes       Intervention Provide education and  explanation on how to use RPE scale       Expected Outcomes Short Term: Able to use RPE daily in rehab to express subjective intensity level;Long Term:  Able to use RPE to guide intensity level when exercising independently       Able to understand and use Dyspnea scale Yes       Intervention Provide education and explanation on how to use Dyspnea scale       Expected Outcomes Short Term: Able to use Dyspnea scale daily in rehab to express subjective sense of shortness of breath during exertion;Long Term: Able to use Dyspnea scale to guide intensity level when exercising independently       Knowledge and understanding of Target Heart Rate Range (THRR) Yes       Intervention Provide education and explanation of THRR including how the numbers were predicted and where they are located for reference       Expected Outcomes Short Term: Able to state/look up THRR;Short Term: Able to use daily as guideline for intensity in rehab;Long Term: Able to use THRR to govern intensity when exercising independently       Able to check pulse independently Yes       Intervention Provide education and demonstration on how to check pulse in carotid and radial arteries.;Review the importance of being able to check your own pulse for safety during independent exercise       Expected Outcomes Short Term: Able to explain why pulse checking is important during independent exercise;Long Term: Able to check pulse independently and accurately       Understanding of Exercise Prescription Yes       Intervention Provide education, explanation, and written materials on patient's individual exercise prescription       Expected Outcomes Short Term: Able  to explain program exercise prescription;Long Term: Able to explain home exercise prescription to exercise independently                Exercise Goals Re-Evaluation :  Exercise Goals Re-Evaluation     [place] Name 07/01/20 1413 07/09/20 1254 07/23/20 1523 08/06/20 1348        Exercise Goal Re-Evaluation   Exercise Goals Review Increase Physical Activity;Able to understand and use rate of perceived exertion (RPE) scale;Knowledge and understanding of Target Heart Rate Range (THRR);Understanding of Exercise Prescription;Increase Strength and Stamina;Able to understand and use Dyspnea scale;Able to check pulse independently Increase Physical Activity;Increase Strength and Stamina Increase Physical Activity;Increase Strength and Stamina;Understanding of Exercise Prescription Increase Strength and Stamina    Comments Reviewed RPE and dyspnea scales, THR and program prescription with pt today.  Pt voiced understanding and was given a copy of goals to take home. [place] has someSI joint/sciatica pain with exercise.  Staff reviewed some stretches that may help. Manson returned this week after being out sick for a week.  He was able to pick back up again. We will continue to monitor his progress. Ell rated walking the track RPE 9 even though he did 51 laps.  Staff will reassign him to TM for more challenge.    Expected Outcomes Short: Use RPE daily to regulate intensity. Long: Follow program prescription in THR. Short: continue exercise as tolerated Long: build overall stamina Short: Continue to attend reguarly again Long: Continue to improve stamina. Short: use Tm with incline instead of track Long: build overall stamina             Discharge Exercise Prescription (Final Exercise Prescription Changes):  Exercise Prescription Changes - 08/06/20 1300       Response to Exercise   Blood Pressure (Admit) 110/70    Blood Pressure (Exercise) 150/76    Blood Pressure (Exit) 120/72    Heart Rate (Admit) 85 bpm    Heart Rate (Exercise) 104 bpm    Heart Rate (Exit) 100 bpm    Oxygen Saturation (Admit) 94 %    Oxygen Saturation (Exercise) 92 %    Oxygen Saturation (Exit) 94 %    Rating of Perceived Exertion (Exercise) 12    Duration Continue with 30 min of aerobic exercise without  signs/symptoms of physical distress.    Intensity THRR unchanged      Progression   Progression Continue to progress workloads to maintain intensity without signs/symptoms of physical distress.    Average METs 3.75      Resistance Training   Training Prescription Yes    Weight 3 lb    Reps 10-15      NuStep   Level 3    Minutes 15    METs 3      Track   Laps 51    Minutes 15    METs 4.5             Nutrition:  Target Goals: Understanding of nutrition guidelines, daily intake of sodium 1500mg , cholesterol 200mg , calories 30% from fat and 7% or less from saturated fats, daily to have 5 or more servings of fruits and vegetables.  Education: All About Nutrition: -Group instruction provided by verbal, written material, interactive activities, discussions, models, and posters to present general guidelines for heart healthy nutrition including fat, fiber, MyPlate, the role of sodium in heart healthy nutrition, utilization of the nutrition label, and utilization of this knowledge for meal planning. Follow up email sent as well. Written  material given at graduation.   Biometrics:    Nutrition Therapy Plan and Nutrition Goals:  Nutrition Therapy & Goals - 07/03/20 1334       Personal Nutrition Goals   Comments He will normally eat 2x/day B: tsp of butter and brown sugar - rinsed out peaches on oatmeal L/D: [employer] or [employer] bowls with cheese, vegetables and meat or steak (no vegetables with steaks)- $35 steaks for $10 S: oatmeal cookie. He reports lowered appetite. Weight stable - about 205lbs. He reports not being able to sleep well right now and he is tired all the time. He reports being lower income.             Nutrition Assessments:  MEDIFICTS Score Key: ?70 Need to make dietary changes  40-70 Heart Healthy Diet ? 40 Therapeutic Level Cholesterol Diet  Flowsheet Row Pulmonary Rehab from 06/27/2020 in [facility] Cardiac and Pulmonary Rehab  Picture Your Plate Total  Score on Admission 31      Picture Your Plate Scores: <41 Unhealthy dietary pattern with much room for improvement. 41-50 Dietary pattern unlikely to meet recommendations for good health and room for improvement. 51-60 More healthful dietary pattern, with some room for improvement.  >60 Healthy dietary pattern, although there may be some specific behaviors that could be improved.   Nutrition Goals Re-Evaluation:   Nutrition Goals Discharge (Final Nutrition Goals Re-Evaluation):   Psychosocial: Target Goals: Acknowledge presence or absence of significant depression and/or stress, maximize coping skills, provide positive support system. Participant is able to verbalize types and ability to use techniques and skills needed for reducing stress and depression.   Education: Stress, Anxiety, and Depression - Group verbal and visual presentation to define topics covered.  Reviews how body is impacted by stress, anxiety, and depression.  Also discusses healthy ways to reduce stress and to treat/manage anxiety and depression.  Written material given at graduation. Flowsheet Row Pulmonary Rehab from 08/14/2020 in [facility] Cardiac and Pulmonary Rehab  Date 08/14/20  Educator AS  Instruction Review Code 1- Verbalizes Understanding       Education: Sleep Hygiene -Provides group verbal and written instruction about how sleep can affect your health.  Define sleep hygiene, discuss sleep cycles and impact of sleep habits. Review good sleep hygiene tips.    Initial Review & Psychosocial Screening:  Initial Psych Review & Screening - 06/18/20 0941       Initial Review   Current issues with None Identified      Family Dynamics   Good Support System? Yes    Comments His wife and his ministers are a good support system for him.      Barriers   Psychosocial barriers to participate in program The patient should benefit from training in stress management and relaxation.      Screening Interventions    Interventions Encouraged to exercise;To provide support and resources with identified psychosocial needs;Provide feedback about the scores to participant    Expected Outcomes Short Term goal: Utilizing psychosocial counselor, staff and physician to assist with identification of specific Stressors or current issues interfering with healing process. Setting desired goal for each stressor or current issue identified.;Long Term Goal: Stressors or current issues are controlled or eliminated.;Short Term goal: Identification and review with participant of any Quality of Life or Depression concerns found by scoring the questionnaire.;Long Term goal: The participant improves quality of Life and PHQ9 Scores as seen by post scores and/or verbalization of changes  Quality of Life Scores:  Scores of 19 and below usually indicate a poorer quality of life in these areas.  A difference of  2-3 points is a clinically meaningful difference.  A difference of 2-3 points in the total score of the Quality of Life Index has been associated with significant improvement in overall quality of life, self-image, physical symptoms, and general health in studies assessing change in quality of life.  PHQ-9: Recent Review Flowsheet Data     Depression screen [facility] 2/9 06/27/2020 08/02/2018 07/05/2018 11/17/2016   Decreased Interest 1 0 0 0   Down, Depressed, Hopeless 0 0 0 0   PHQ - 2 Score 1 0 0 0   Altered sleeping 2  - - 0   Tired, decreased energy 2 - - 1   Change in appetite 0 - - 0   Feeling bad or failure about yourself  0 - - 0   Trouble concentrating 0 - - 0   Moving slowly or fidgety/restless 2 - - 0   Suicidal thoughts 0 - - 0   PHQ-9 Score 7 - - 1   Difficult doing work/chores - - - Somewhat difficult      Interpretation of Total Score  Total Score Depression Severity:  1-4 = Minimal depression, 5-9 = Mild depression, 10-14 = Moderate depression, 15-19 = Moderately severe depression, 20-27 =  Severe depression   Psychosocial Evaluation and Intervention:  Psychosocial Evaluation - 06/18/20 0942       Psychosocial Evaluation & Interventions   Interventions Encouraged to exercise with the program and follow exercise prescription;Relaxation education;Stress management education    Comments His wife and his ministers are a good support system for him.    Expected Outcomes Short: Exercise regularly to support mental health and notify staff of any changes. Long: maintain mental health and well being through teaching of rehab or prescribed medications independently.    Continue Psychosocial Services  Follow up required by staff             Psychosocial Re-Evaluation:   Psychosocial Discharge (Final Psychosocial Re-Evaluation):   Education: Education Goals: Education classes will be provided on a weekly basis, covering required topics. Participant will state understanding/return demonstration of topics presented.  Learning Barriers/Preferences:  Learning Barriers/Preferences - 06/18/20 4944       Learning Barriers/Preferences   Learning Barriers None    Learning Preferences None             General Pulmonary Education Topics:  Infection Prevention: - Provides verbal and written material to individual with discussion of infection control including proper hand washing and proper equipment cleaning during exercise session. Flowsheet Row Pulmonary Rehab from 08/14/2020 in [facility] Cardiac and Pulmonary Rehab  Date 06/27/20  Educator AS  Instruction Review Code 1- Verbalizes Understanding       Falls Prevention: - Provides verbal and written material to individual with discussion of falls prevention and safety. Flowsheet Row Pulmonary Rehab from 08/14/2020 in [facility] Cardiac and Pulmonary Rehab  Date 06/27/20  Educator AS  Instruction Review Code 1- Verbalizes Understanding       Chronic Lung Disease Review: - Group verbal instruction with posters, models,  PowerPoint presentations and videos,  to review new updates, new respiratory medications, new advancements in procedures and treatments. Providing information on websites and "800" numbers for continued self-education. Includes information about supplement oxygen, available portable oxygen systems, continuous and intermittent flow rates, oxygen safety, concentrators, and Medicare reimbursement for oxygen. Explanation of Pulmonary Drugs,  including class, frequency, complications, importance of spacers, rinsing mouth after steroid MDI's, and proper cleaning methods for nebulizers. Review of basic lung anatomy and physiology related to function, structure, and complications of lung disease. Review of risk factors. Discussion about methods for diagnosing sleep apnea and types of masks and machines for OSA. Includes a review of the use of types of environmental controls: home humidity, furnaces, filters, dust mite/pet prevention, HEPA vacuums. Discussion about weather changes, air quality and the benefits of nasal washing. Instruction on Warning signs, infection symptoms, calling MD promptly, preventive modes, and value of vaccinations. Review of effective airway clearance, coughing and/or vibration techniques. Emphasizing that all should Create an Action Plan. Written material given at graduation. Flowsheet Row Pulmonary Rehab from 12/09/2016 in [facility] Cardiac and Pulmonary Rehab  Date 12/09/16  Educator [facility]  Instruction Review Code 1- Verbalizes Understanding       AED/CPR: - Group verbal and written instruction with the use of models to demonstrate the basic use of the AED with the basic ABC's of resuscitation.    Anatomy and Cardiac Procedures: - Group verbal and visual presentation and models provide information about basic cardiac anatomy and function. Reviews the testing methods done to diagnose heart disease and the outcomes of the test results. Describes the treatment choices: Medical Management,  Angioplasty, or Coronary Bypass Surgery for treating various heart conditions including Myocardial Infarction, Angina, Valve Disease, and Cardiac Arrhythmias.  Written material given at graduation. Flowsheet Row Pulmonary Rehab from 08/14/2020 in [place] Cardiac and Pulmonary Rehab  Date 07/03/20  Educator [facility]  Instruction Review Code 1- Verbalizes Understanding       Medication Safety: - Group verbal and visual instruction to review commonly prescribed medications for heart and lung disease. Reviews the medication, class of the drug, and side effects. Includes the steps to properly store meds and maintain the prescription regimen.  Written material given at graduation. Flowsheet Row Pulmonary Rehab from 08/14/2020 in [facility] Cardiac and Pulmonary Rehab  Date 07/24/20  Educator SB  Instruction Review Code 1- Verbalizes Understanding       Other: -Provides group and verbal instruction on various topics (see comments)   Knowledge Questionnaire Score:  Knowledge Questionnaire Score - 06/27/20 1534       Knowledge Questionnaire Score   Pre Score 15/18              Core Components/Risk Factors/Patient Goals at Admission:  Personal Goals and Risk Factors at Admission - 06/27/20 1539       Core Components/Risk Factors/Patient Goals on Admission    Weight Management Yes;Weight Loss    Intervention Weight Management: Develop a combined nutrition and exercise program designed to reach desired caloric intake, while maintaining appropriate intake of nutrient and fiber, sodium and fats, and appropriate energy expenditure required for the weight goal.;Weight Management: Provide education and appropriate resources to help participant work on and attain dietary goals.;Weight Management/Obesity: Establish reasonable short term and long term weight goals.    Expected Outcomes Long Term: Adherence to nutrition and physical activity/exercise program aimed toward attainment of established weight  goal;Short Term: Continue to assess and modify interventions until short term weight is achieved;Weight Loss: Understanding of general recommendations for a balanced deficit meal plan, which promotes 1-2 lb weight loss per week and includes a negative energy balance of [phone] kcal/d;Understanding recommendations for meals to include 15-35% energy as protein, 25-35% energy from fat, 35-60% energy from carbohydrates, less than 200mg  of dietary cholesterol, 20-35 gm of total fiber daily;Understanding  of distribution of calorie intake throughout the day with the consumption of 4-5 meals/snacks    Intervention Provide education, individualized exercise plan and daily activity instruction to help decrease symptoms of SOB with activities of daily living.    Expected Outcomes Short Term: Improve cardiorespiratory fitness to achieve a reduction of symptoms when performing ADLs;Long Term: Be able to perform more ADLs without symptoms or delay the onset of symptoms    Hypertension Yes    Intervention Provide education on lifestyle modifcations including regular physical activity/exercise, weight management, moderate sodium restriction and increased consumption of fresh fruit, vegetables, and low fat dairy, alcohol moderation, and smoking cessation.;Monitor prescription use compliance.    Expected Outcomes Short Term: Continued assessment and intervention until BP is < 140/67mm HG in hypertensive participants. < 130/33mm HG in hypertensive participants with diabetes, heart failure or chronic kidney disease.;Long Term: Maintenance of blood pressure at goal levels.    Lipids Yes    Intervention Provide education and support for participant on nutrition & aerobic/resistive exercise along with prescribed medications to achieve LDL 70mg , HDL >40mg .    Expected Outcomes Short Term: Participant states understanding of desired cholesterol values and is compliant with medications prescribed. Participant is following exercise  prescription and nutrition guidelines.;Long Term: Cholesterol controlled with medications as prescribed, with individualized exercise RX and with personalized nutrition plan. Value goals: LDL < 70mg , HDL > 40 mg.             Education:Diabetes - Individual verbal and written instruction to review signs/symptoms of diabetes, desired ranges of glucose level fasting, after meals and with exercise. Acknowledge that pre and post exercise glucose checks will be done for 3 sessions at entry of program.   Know Your Numbers and Heart Failure: - Group verbal and visual instruction to discuss disease risk factors for cardiac and pulmonary disease and treatment options.  Reviews associated critical values for Overweight/Obesity, Hypertension, Cholesterol, and Diabetes.  Discusses basics of heart failure: signs/symptoms and treatments.  Introduces Heart Failure Zone chart for action plan for heart failure.  Written material given at graduation. Flowsheet Row Pulmonary Rehab from 08/14/2020 in [facility] Cardiac and Pulmonary Rehab  Date 07/31/20  Educator [facility]  Instruction Review Code 1- Verbalizes Understanding       Core Components/Risk Factors/Patient Goals Review:    Core Components/Risk Factors/Patient Goals at Discharge (Final Review):    ITP Comments:  ITP Comments     Row Name 06/18/20 1001 07/01/20 1411 07/24/20 0609 08/15/20 1345     ITP Comments Virtual Visit completed. Patient informed on EP and RD appointment and 6 Minute walk test. Patient also informed of patient health questionnaires on My Chart. Patient Verbalizes understanding. Visit diagnosis can be found in [facility] under Media. Patients [facility] orders to be scanned. First full day of exercise!  Patient was oriented to gym and equipment including functions, settings, policies, and procedures.  Patient's individual exercise prescription and treatment plan were reviewed.  All starting workloads were established based on the results of the 6 minute  walk test done at initial orientation visit.  The plan for exercise progression was also introduced and progression will be customized based on patient's performance and goals. 30 Day review completed. Medical Director ITP review done, changes made as directed, and signed approval by Medical Director.   only 2 visits in May Pt called to withdraw from program due to insurance issues/past billing amounts due             Comments: discharge  ITP

## 2020-08-15 NOTE — Progress Notes (Unsigned)
Discharge Progress Report  Patient Details  Name: Duane Klein MRN: [id number] Date of Birth: [birth year] Referring Provider:   Flowsheet Row Pulmonary Rehab from 06/27/2020 in [facility] Cardiac and Pulmonary Rehab  Referring Provider [name]        Number of Visits: 9   Reason for Discharge:  Early Exit:  Insurance   Smoking History:  Social History   Tobacco Use  Smoking Status Former   Packs/day: 3.00   Years: 35.00   Pack years: 105.00   Types: Cigarettes   Quit date: 04/09/2002   Years since quitting: 18.3  Smokeless Tobacco Former   Types: Snuff, Sarina Ser   Quit date: 03/09/1997  Tobacco Comments   patient has not smoked since 2004    Diagnosis:  No diagnosis found.  ADL UCSD:  Pulmonary Assessment Scores     Row Name 06/27/20 1532         ADL UCSD   ADL Phase Entry     SOB Score total 20     Rest 0     Walk 1     Stairs 3     Bath 0     Dress 1     Shop 2           CAT Score     CAT Score 15             Initial Exercise Prescription:  Initial Exercise Prescription - 06/27/20 1500       Date of Initial Exercise RX and Referring Provider   Date 06/27/20    Referring Provider [name]      Treadmill   MPH 2.5    Grade 1    Minutes 15    METs 3.3      Recumbant Bike   Level 3    RPM 60    Watts 40    Minutes 15    METs 3.3      NuStep   Level 3    SPM 80    Minutes 15    METs 3.3      Recumbant Elliptical   Level 2    RPM 50    Minutes 15    METs 3.3      T5 Nustep   Level 2    SPM 80    Minutes 15    METs 3.3      Prescription Details   Frequency (times per week) 3    Duration Progress to 30 minutes of continuous aerobic without signs/symptoms of physical distress      Intensity   THRR 40-80% of Max Heartrate 118-141    Ratings of Perceived Exertion 11-15    Perceived Dyspnea 0-4      Resistance Training   Training Prescription Yes    Weight 3 lb    Reps 10-15             Discharge Exercise Prescription  (Final Exercise Prescription Changes):  Exercise Prescription Changes - 08/06/20 1300       Response to Exercise   Blood Pressure (Admit) 110/70    Blood Pressure (Exercise) 150/76    Blood Pressure (Exit) 120/72    Heart Rate (Admit) 85 bpm    Heart Rate (Exercise) 104 bpm    Heart Rate (Exit) 100 bpm    Oxygen Saturation (Admit) 94 %    Oxygen Saturation (Exercise) 92 %    Oxygen Saturation (Exit) 94 %    Rating of Perceived Exertion (Exercise) 12  Duration Continue with 30 min of aerobic exercise without signs/symptoms of physical distress.    Intensity THRR unchanged      Progression   Progression Continue to progress workloads to maintain intensity without signs/symptoms of physical distress.    Average METs 3.75      Resistance Training   Training Prescription Yes    Weight 3 lb    Reps 10-15      NuStep   Level 3    Minutes 15    METs 3      Track   Laps 51    Minutes 15    METs 4.5             Functional Capacity:  6 Minute Walk     Row Name 06/27/20 1519         6 Minute Walk   Phase Initial     Distance 1300 feet     Walk Time 6 minutes     # of Rest Breaks 0     MPH 2.46     METS 3.32     RPE 9     Perceived Dyspnea  1     VO2 Peak 11.65     Symptoms Yes (comment)     Comments Si joint pain 4/10 (chronic)     Resting HR 94 bpm     Resting BP 128/64     Resting Oxygen Saturation  93 %     Exercise Oxygen Saturation  during 6 min walk 89 %     Max Ex. HR 105 bpm     Max Ex. BP 138/76     2 Minute Post BP 112/60           Interval HR     1 Minute HR 101     2 Minute HR 100     3 Minute HR 104     4 Minute HR 100     5 Minute HR 88     6 Minute HR 105     2 Minute Post HR 82     Interval Heart Rate? Yes           Interval Oxygen     Interval Oxygen? Yes     Baseline Oxygen Saturation % 93 %     1 Minute Oxygen Saturation % 93 %     1 Minute Liters of Oxygen 0 L     2 Minute Oxygen Saturation % 91 %     2 Minute Liters of  Oxygen 0 L     3 Minute Oxygen Saturation % 89 %     3 Minute Liters of Oxygen 0 L     4 Minute Oxygen Saturation % 91 %     4 Minute Liters of Oxygen 0 L     5 Minute Oxygen Saturation % 91 %     5 Minute Liters of Oxygen 0 L     6 Minute Oxygen Saturation % 91 %     6 Minute Liters of Oxygen 0 L     2 Minute Post Oxygen Saturation % 93 %     2 Minute Post Liters of Oxygen 0 L             Psychological, QOL, Others - Outcomes: PHQ 2/9: Depression screen [facility] 2/9 06/27/2020 08/02/2018 07/05/2018 11/17/2016  Decreased Interest 1 0 0 0  Down, Depressed, Hopeless 0 0 0 0  PHQ - 2 Score 1  0 0 0  Altered sleeping 2 - - 0  Tired, decreased energy 2 - - 1  Change in appetite 0 - - 0  Feeling bad or failure about yourself  0 - - 0  Trouble concentrating 0 - - 0  Moving slowly or fidgety/restless 2 - - 0  Suicidal thoughts 0 - - 0  PHQ-9 Score 7 - - 1  Difficult doing work/chores - - - Somewhat difficult    Quality of Life:   Nutrition & Weight - Outcomes:    Nutrition:  Nutrition Therapy & Goals - 07/03/20 1334       Personal Nutrition Goals   Comments He will normally eat 2x/day B: tsp of butter and brown sugar - rinsed out peaches on oatmeal L/D: [employer] or [employer] bowls with cheese, vegetables and meat or steak (no vegetables with steaks)- $35 steaks for $10 S: oatmeal cookie. He reports lowered appetite. Weight stable - about 205lbs. He reports not being able to sleep well right now and he is tired all the time. He reports being lower income.             Nutrition Discharge:   Education Questionnaire Score:  Knowledge Questionnaire Score - 06/27/20 1534       Knowledge Questionnaire Score   Pre Score 15/18             Goals reviewed with patient; copy given to patient.

## 2020-08-21 ENCOUNTER — Encounter: Payer: Self-pay | Admitting: *Deleted

## 2020-08-21 DIAGNOSIS — J439 Emphysema, unspecified: Secondary | ICD-10-CM

## 2020-08-21 NOTE — Progress Notes (Signed)
Pulmonary Individual Treatment Plan  Patient Details  Name: Duane Klein MRN: [id number] Date of Birth: 08/09/1950 Referring Provider:   Flowsheet Row Pulmonary Rehab from 06/27/2020 in [facility] Cardiac and Pulmonary Rehab  Referring Provider [name]       Initial Encounter Date:  Flowsheet Row Pulmonary Rehab from 06/27/2020 in [facility] Cardiac and Pulmonary Rehab  Date 06/27/20       Visit Diagnosis: Pulmonary emphysema, unspecified emphysema type ([place])  Patient's Home Medications on Admission:  Current Outpatient Medications:    acetaminophen (TYLENOL) 325 MG tablet, TAKE TWO TABLETS BY MOUTH AS NEEDED, Disp: , Rfl:    albuterol (PROVENTIL) (2.5 MG/3ML) 0.083% nebulizer solution, Take 3 mLs (2.5 mg total) by nebulization every 6 (six) hours as needed for wheezing or shortness of breath., Disp: 75 mL, Rfl: 1   albuterol (VENTOLIN HFA) 108 (90 Base) MCG/ACT inhaler, INHALE 2 PUFFS BY ORAL INHALATION EVERY 6 HOURS FOR BREATHING. BE SURE TO [place] MOUTHPIECE WITH WARM WATER ONCE A WEEK, Disp: , Rfl:    amitriptyline (ELAVIL) 10 MG tablet, Take 2 tablets by mouth at bedtime., Disp: , Rfl:    amitriptyline (ELAVIL) 100 MG tablet, Take 100 mg by mouth at bedtime. (Patient not taking: Reported on 06/18/2020), Disp: , Rfl:    aspirin 81 MG EC tablet, Take by mouth., Disp: , Rfl:    chlorpheniramine (CHLOR-TRIMETON) 4 MG tablet, Take 4 mg by mouth 2 (two) times daily as needed for allergies., Disp: , Rfl:    colchicine 0.6 MG tablet, TAKE TABLET(S) BY MOUTH AS DIRECTED : 2 TABLETS TO START, THEN ONE TABLET ONE HOUR LATER; MAY REPEAT IN 3 DAYS, Disp: , Rfl:    diclofenac sodium (VOLTAREN) 1 % GEL, Apply 2 g topically 4 (four) times daily., Disp: , Rfl:    diltiazem (CARDIZEM) 120 MG tablet, Take 120 mg by mouth 4 (four) times daily., Disp: , Rfl:    diltiazem (TIAZAC) 120 MG 24 hr capsule, Take by mouth., Disp: , Rfl:    ergocalciferol (VITAMIN D2) 1.25 MG (50000 UT) capsule, Take by mouth., Disp: ,  Rfl:    fluticasone (FLONASE) 50 MCG/ACT nasal spray, INSTILL 2 SPRAYS INTO EACH NOSTRIL EVERY DAY MAXIMUM 2 SPRAYS IN EACH NOSTRIL DAILY. FOR ALLERGIES TILT HEAD FORWARD, AIM AS DIRECTED, Disp: , Rfl:    lisinopril (ZESTRIL) 10 MG tablet, Take 10 mg by mouth daily. (Patient not taking: Reported on 06/18/2020), Disp: , Rfl:    lisinopril (ZESTRIL) 20 MG tablet, TAKE ONE-HALF TABLET BY MOUTH EVERY DAY FOR BLOOD PRESSURE, Disp: , Rfl:    loratadine (CLARITIN) 10 MG tablet, Take 1 tablet by mouth daily., Disp: , Rfl:    methocarbamol (ROBAXIN) 750 MG tablet, Take 750 mg by mouth 4 (four) times daily., Disp: , Rfl:    mometasone (ASMANEX) 220 MCG/INH inhaler, Inhale 2 puffs into the lungs daily., Disp: , Rfl:    montelukast (SINGULAIR) 10 MG tablet, TAKE 1 TABLET(10 MG) BY MOUTH EVERY NIGHT, Disp: , Rfl:    Multiple Vitamin (MULTI-VITAMIN) tablet, Take 1 tablet by mouth daily., Disp: , Rfl:    Multiple Vitamins-Minerals (MULTIVITAMIN ADULT EXTRA C PO), TAKE 1 TABLET BY MOUTH ONCE EVERY DAY FOR USE AS A VITAMIN SUPPLEMENT (Patient not taking: Reported on 06/18/2020), Disp: , Rfl:    omeprazole (PRILOSEC OTC) 20 MG tablet, Take by mouth. (Patient not taking: Reported on 06/18/2020), Disp: , Rfl:    omeprazole (PRILOSEC) 20 MG capsule, Take 20 mg by mouth daily. (Patient not  taking: Reported on 06/18/2020), Disp: , Rfl:    omeprazole (PRILOSEC) 40 MG capsule, TAKE ONE CAPSULE BY MOUTH TWICE DAILY BEFORE MORNING AND EVENING MEAL TO CONTROL STOMACH ACID.  TAKE 30 MINUTES BEFORE A MEAL, Disp: , Rfl:    ondansetron (ZOFRAN ODT) 4 MG disintegrating tablet, Take 1 tablet (4 mg total) by mouth every 8 (eight) hours as needed for nausea or vomiting., Disp: 10 tablet, Rfl: 0   predniSONE (DELTASONE) 10 MG tablet, Take 1 tablet (10 mg total) by mouth daily. Day 1-3: take 4 tablets PO daily Day 4-6: take 3 tablets PO daily Day 7-9: take 2 tablets PO daily Day 10-12: take 1 tablet PO daily (Patient not taking: Reported on  06/18/2020), Disp: 30 tablet, Rfl: 0   simvastatin (ZOCOR) 20 MG tablet, Take 20 mg by mouth daily. (Patient not taking: Reported on 06/18/2020), Disp: , Rfl:    simvastatin (ZOCOR) 20 MG tablet, TAKE ONE-HALF TABLET BY MOUTH AT BEDTIME FOR CHOLESTEROL, Disp: , Rfl:    terazosin (HYTRIN) 10 MG capsule, Take 10 mg by mouth at bedtime. (Patient not taking: Reported on 06/18/2020), Disp: , Rfl:    terazosin (HYTRIN) 5 MG capsule, Take 1 capsule by mouth at bedtime., Disp: , Rfl:    Tiotropium Bromide-Olodaterol 2.5-2.5 MCG/ACT AERS, Inhale into the lungs once. (Patient not taking: Reported on 06/18/2020), Disp: , Rfl:    Tiotropium Bromide-Olodaterol 2.5-2.5 MCG/ACT AERS, Inhale into the lungs., Disp: , Rfl:    topiramate (TOPAMAX) 100 MG tablet, Take 100 mg by mouth 2 (two) times daily., Disp: , Rfl:   Past Medical History: Past Medical History:  Diagnosis Date   Atrial fibrillation ([place])    Cancer of kidney ([place])    Emphysema lung ([place])    Hypertension     Tobacco Use: Social History   Tobacco Use  Smoking Status Former   Packs/day: 3.00   Years: 35.00   Pack years: 105.00   Types: Cigarettes   Quit date: 04/09/2002   Years since quitting: 18.3  Smokeless Tobacco Former   Types: Snuff, Chew   Quit date: 03/09/1997  Tobacco Comments   patient has not smoked since 2004    Labs: Recent Review Flowsheet Data   There is no flowsheet data to display.      Pulmonary Assessment Scores:  Pulmonary Assessment Scores     Row Name 06/27/20 1532         ADL UCSD   ADL Phase Entry     SOB Score total 20     Rest 0     Walk 1     Stairs 3     Bath 0     Dress 1     Shop 2           CAT Score     CAT Score 15             UCSD: Self-administered rating of dyspnea associated with activities of daily living (ADLs) 6-point scale (0 = "not at all" to 5 = "maximal or unable to do because of breathlessness")  Scoring Scores range from 0 to 120.  Minimally important difference  is 5 units  CAT: CAT can identify the health impairment of COPD patients and is better correlated with disease progression.  CAT has a scoring range of zero to 40. The CAT score is classified into four groups of low (less than 10), medium (10 - 20), high (21-30) and very high (31-40) based on the  impact level of disease on health status. A CAT score over 10 suggests significant symptoms.  A worsening CAT score could be explained by an exacerbation, poor medication adherence, poor inhaler technique, or progression of COPD or comorbid conditions.  CAT MCID is 2 points  mMRC: mMRC (Modified Medical Research Council) Dyspnea Scale is used to assess the degree of baseline functional disability in patients of respiratory disease due to dyspnea. No minimal important difference is established. A decrease in score of 1 point or greater is considered a positive change.   Pulmonary Function Assessment:   Exercise Target Goals: Exercise Program Goal: Individual exercise prescription set using results from initial 6 min walk test and THRR while considering  patient's activity barriers and safety.   Exercise Prescription Goal: Initial exercise prescription builds to 30-45 minutes a day of aerobic activity, 2-3 days per week.  Home exercise guidelines will be given to patient during program as part of exercise prescription that the participant will acknowledge.  Education: Aerobic Exercise: - Group verbal and visual presentation on the components of exercise prescription. Introduces F.I.T.T principle from ACSM for exercise prescriptions.  Reviews F.I.T.T. principles of aerobic exercise including progression. Written material given at graduation.   Education: Resistance Exercise: - Group verbal and visual presentation on the components of exercise prescription. Introduces F.I.T.T principle from ACSM for exercise prescriptions  Reviews F.I.T.T. principles of resistance exercise including progression.  Written material given at graduation. Flowsheet Row Pulmonary Rehab from 08/14/2020 in [facility] Cardiac and Pulmonary Rehab  Date 07/03/20  Educator AS  Instruction Review Code 1- Verbalizes Understanding        Education: Exercise & Equipment Safety: - Individual verbal instruction and demonstration of equipment use and safety with use of the equipment. Flowsheet Row Pulmonary Rehab from 08/14/2020 in [facility] Cardiac and Pulmonary Rehab  Date 06/27/20  Educator AS  Instruction Review Code 1- Verbalizes Understanding       Education: Exercise Physiology & General Exercise Guidelines: - Group verbal and written instruction with models to review the exercise physiology of the cardiovascular system and associated critical values. Provides general exercise guidelines with specific guidelines to those with heart or lung disease.    Education: Flexibility, Balance, Mind/Body Relaxation: - Group verbal and visual presentation with interactive activity on the components of exercise prescription. Introduces F.I.T.T principle from ACSM for exercise prescriptions. Reviews F.I.T.T. principles of flexibility and balance exercise training including progression. Also discusses the mind body connection.  Reviews various relaxation techniques to help reduce and manage stress (i.e. Deep breathing, progressive muscle relaxation, and visualization). Balance handout provided to take home. Written material given at graduation.   Activity Barriers & Risk Stratification:   6 Minute Walk:  6 Minute Walk     Row Name 06/27/20 1519         6 Minute Walk   Phase Initial     Distance 1300 feet     Walk Time 6 minutes     # of Rest Breaks 0     MPH 2.46     METS 3.32     RPE 9     Perceived Dyspnea  1     VO2 Peak 11.65     Symptoms Yes (comment)     Comments Si joint pain 4/10 (chronic)     Resting HR 94 bpm     Resting BP 128/64     Resting Oxygen Saturation  93 %     Exercise Oxygen Saturation  during  6  min walk 89 %     Max Ex. HR 105 bpm     Max Ex. BP 138/76     2 Minute Post BP 112/60           Interval HR     1 Minute HR 101     2 Minute HR 100     3 Minute HR 104     4 Minute HR 100     5 Minute HR 88     6 Minute HR 105     2 Minute Post HR 82     Interval Heart Rate? Yes           Interval Oxygen     Interval Oxygen? Yes     Baseline Oxygen Saturation % 93 %     1 Minute Oxygen Saturation % 93 %     1 Minute Liters of Oxygen 0 L     2 Minute Oxygen Saturation % 91 %     2 Minute Liters of Oxygen 0 L     3 Minute Oxygen Saturation % 89 %     3 Minute Liters of Oxygen 0 L     4 Minute Oxygen Saturation % 91 %     4 Minute Liters of Oxygen 0 L     5 Minute Oxygen Saturation % 91 %     5 Minute Liters of Oxygen 0 L     6 Minute Oxygen Saturation % 91 %     6 Minute Liters of Oxygen 0 L     2 Minute Post Oxygen Saturation % 93 %     2 Minute Post Liters of Oxygen 0 L            Oxygen Initial Assessment:   Oxygen Re-Evaluation:  Oxygen Re-Evaluation     Row Name 07/01/20 1414             Program Oxygen Prescription   Program Oxygen Prescription None               Home Oxygen     Home Oxygen Device None       Sleep Oxygen Prescription None       Home Exercise Oxygen Prescription None       Home Resting Oxygen Prescription None               Goals/Expected Outcomes     Short Term Goals To learn and understand importance of monitoring SPO2 with pulse oximeter and demonstrate accurate use of the pulse oximeter.;To learn and understand importance of maintaining oxygen saturations>88%;To learn and demonstrate proper pursed lip breathing techniques or other breathing techniques.        Long  Term Goals Verbalizes importance of monitoring SPO2 with pulse oximeter and return demonstration;Maintenance of O2 saturations>88%;Exhibits proper breathing techniques, such as pursed lip breathing or other method taught during program session;Compliance with  respiratory medication       Comments Reviewed PLB technique with pt.  Talked about how it works and it's importance in maintaining their exercise saturations.       Goals/Expected Outcomes Short: Become more profiecient at using PLB.   Long: Become independent at using PLB.               Oxygen Discharge (Final Oxygen Re-Evaluation):  Oxygen Re-Evaluation - 07/01/20 1414       Program Oxygen Prescription   Program Oxygen Prescription None      Home Oxygen  Home Oxygen Device None    Sleep Oxygen Prescription None    Home Exercise Oxygen Prescription None    Home Resting Oxygen Prescription None      Goals/Expected Outcomes   Short Term Goals To learn and understand importance of monitoring SPO2 with pulse oximeter and demonstrate accurate use of the pulse oximeter.;To learn and understand importance of maintaining oxygen saturations>88%;To learn and demonstrate proper pursed lip breathing techniques or other breathing techniques.     Long  Term Goals Verbalizes importance of monitoring SPO2 with pulse oximeter and return demonstration;Maintenance of O2 saturations>88%;Exhibits proper breathing techniques, such as pursed lip breathing or other method taught during program session;Compliance with respiratory medication    Comments Reviewed PLB technique with pt.  Talked about how it works and it's importance in maintaining their exercise saturations.    Goals/Expected Outcomes Short: Become more profiecient at using PLB.   Long: Become independent at using PLB.             Initial Exercise Prescription:  Initial Exercise Prescription - 06/27/20 1500       Date of Initial Exercise RX and Referring Provider   Date 06/27/20    Referring Provider [name]      Treadmill   MPH 2.5    Grade 1    Minutes 15    METs 3.3      Recumbant Bike   Level 3    RPM 60    Watts 40    Minutes 15    METs 3.3      NuStep   Level 3    SPM 80    Minutes 15    METs 3.3       Recumbant Elliptical   Level 2    RPM 50    Minutes 15    METs 3.3      T5 Nustep   Level 2    SPM 80    Minutes 15    METs 3.3      Prescription Details   Frequency (times per week) 3    Duration Progress to 30 minutes of continuous aerobic without signs/symptoms of physical distress      Intensity   THRR 40-80% of Max Heartrate 118-141    Ratings of Perceived Exertion 11-15    Perceived Dyspnea 0-4      Resistance Training   Training Prescription Yes    Weight 3 lb    Reps 10-15             Perform Capillary Blood Glucose checks as needed.  Exercise Prescription Changes:   Exercise Prescription Changes     Row Name 06/27/20 1500 07/09/20 1200 07/23/20 1500 08/06/20 1300       Response to Exercise   Blood Pressure (Admit) 128/64 124/64 136/8 110/70    Blood Pressure (Exercise) 138/76 124/64 148/80 150/76    Blood Pressure (Exit) 112/60 120/68 122/64 120/72    Heart Rate (Admit) 94 bpm 77 bpm 92 bpm 85 bpm    Heart Rate (Exercise) 105 bpm 107 bpm 115 bpm 104 bpm    Heart Rate (Exit) 82 bpm 79 bpm 100 bpm 100 bpm    Oxygen Saturation (Admit) 93 % 93 % 90 % 94 %    Oxygen Saturation (Exercise) 89 % 92 % 92 % 92 %    Oxygen Saturation (Exit) 93 % 94 % 96 % 94 %    Rating of Perceived Exertion (Exercise) 9 13 13  12  Perceived Dyspnea (Exercise) 1 2 3  --    Symptoms SI joint pain 4/10 -- SOB --    Comments -- second day -- --    Duration -- Progress to 30 minutes of  aerobic without signs/symptoms of physical distress Progress to 30 minutes of  aerobic without signs/symptoms of physical distress Continue with 30 min of aerobic exercise without signs/symptoms of physical distress.    Intensity -- THRR unchanged THRR unchanged THRR unchanged         Progression        Progression -- Continue to progress workloads to maintain intensity without signs/symptoms of physical distress. Continue to progress workloads to maintain intensity without signs/symptoms of  physical distress. Continue to progress workloads to maintain intensity without signs/symptoms of physical distress.    Average METs -- 3.55 3.66 3.75         Resistance Training        Training Prescription -- Yes Yes Yes    Weight -- 3 lb 3 lb 3 lb    Reps -- 10-15 10-15 10-15         Treadmill        MPH -- 3 3 --    Grade -- 1 2 --    Minutes -- 15 15 --    METs -- 3.71 4.12 --         NuStep        Level -- 3 3 3     Minutes -- 15 15 15     METs -- 3.4 3.2 3         Track        Laps -- -- -- 51    Minutes -- -- -- 15    METs -- -- -- 4.5            Exercise Comments:   Exercise Comments     Row Name 07/01/20 1412           Exercise Comments First full day of exercise!  Patient was oriented to gym and equipment including functions, settings, policies, and procedures.  Patient's individual exercise prescription and treatment plan were reviewed.  All starting workloads were established based on the results of the 6 minute walk test done at initial orientation visit.  The plan for exercise progression was also introduced and progression will be customized based on patient's performance and goals.                Exercise Goals and Review:   Exercise Goals     Row Name 06/27/20 1541             Exercise Goals   Increase Physical Activity Yes       Intervention Provide advice, education, support and counseling about physical activity/exercise needs.;Develop an individualized exercise prescription for aerobic and resistive training based on initial evaluation findings, risk stratification, comorbidities and participant's personal goals.       Expected Outcomes Short Term: Attend rehab on a regular basis to increase amount of physical activity.;Long Term: Add in home exercise to make exercise part of routine and to increase amount of physical activity.;Long Term: Exercising regularly at least 3-5 days a week.       Increase Strength and Stamina Yes        Intervention Provide advice, education, support and counseling about physical activity/exercise needs.;Develop an individualized exercise prescription for aerobic and resistive training based on initial evaluation findings, risk stratification, comorbidities and participant's personal goals.  Expected Outcomes Short Term: Increase workloads from initial exercise prescription for resistance, speed, and METs.;Short Term: Perform resistance training exercises routinely during rehab and add in resistance training at home;Long Term: Improve cardiorespiratory fitness, muscular endurance and strength as measured by increased METs and functional capacity (6MWT)       Able to understand and use rate of perceived exertion (RPE) scale Yes       Intervention Provide education and explanation on how to use RPE scale       Expected Outcomes Short Term: Able to use RPE daily in rehab to express subjective intensity level;Long Term:  Able to use RPE to guide intensity level when exercising independently       Able to understand and use Dyspnea scale Yes       Intervention Provide education and explanation on how to use Dyspnea scale       Expected Outcomes Short Term: Able to use Dyspnea scale daily in rehab to express subjective sense of shortness of breath during exertion;Long Term: Able to use Dyspnea scale to guide intensity level when exercising independently       Knowledge and understanding of Target Heart Rate Range (THRR) Yes       Intervention Provide education and explanation of THRR including how the numbers were predicted and where they are located for reference       Expected Outcomes Short Term: Able to state/look up THRR;Short Term: Able to use daily as guideline for intensity in rehab;Long Term: Able to use THRR to govern intensity when exercising independently       Able to check pulse independently Yes       Intervention Provide education and demonstration on how to check pulse in carotid and  radial arteries.;Review the importance of being able to check your own pulse for safety during independent exercise       Expected Outcomes Short Term: Able to explain why pulse checking is important during independent exercise;Long Term: Able to check pulse independently and accurately       Understanding of Exercise Prescription Yes       Intervention Provide education, explanation, and written materials on patient's individual exercise prescription       Expected Outcomes Short Term: Able to explain program exercise prescription;Long Term: Able to explain home exercise prescription to exercise independently                Exercise Goals Re-Evaluation :  Exercise Goals Re-Evaluation     [place] Name 07/01/20 1413 07/09/20 1254 07/23/20 1523 08/06/20 1348       Exercise Goal Re-Evaluation   Exercise Goals Review Increase Physical Activity;Able to understand and use rate of perceived exertion (RPE) scale;Knowledge and understanding of Target Heart Rate Range (THRR);Understanding of Exercise Prescription;Increase Strength and Stamina;Able to understand and use Dyspnea scale;Able to check pulse independently Increase Physical Activity;Increase Strength and Stamina Increase Physical Activity;Increase Strength and Stamina;Understanding of Exercise Prescription Increase Strength and Stamina    Comments Reviewed RPE and dyspnea scales, THR and program prescription with pt today.  Pt voiced understanding and was given a copy of goals to take home. Duane Klein has someSI joint/sciatica pain with exercise.  Staff reviewed some stretches that may help. Duane Klein returned this week after being out sick for a week.  He was able to pick back up again. We will continue to monitor his progress. Duane Klein rated walking the track RPE 9 even though he did 51 laps.  Staff will reassign him to TM  for more challenge.    Expected Outcomes Short: Use RPE daily to regulate intensity. Long: Follow program prescription in THR. Short:  continue exercise as tolerated Long: build overall stamina Short: Continue to attend reguarly again Long: Continue to improve stamina. Short: use Tm with incline instead of track Long: build overall stamina             Discharge Exercise Prescription (Final Exercise Prescription Changes):  Exercise Prescription Changes - 08/06/20 1300       Response to Exercise   Blood Pressure (Admit) 110/70    Blood Pressure (Exercise) 150/76    Blood Pressure (Exit) 120/72    Heart Rate (Admit) 85 bpm    Heart Rate (Exercise) 104 bpm    Heart Rate (Exit) 100 bpm    Oxygen Saturation (Admit) 94 %    Oxygen Saturation (Exercise) 92 %    Oxygen Saturation (Exit) 94 %    Rating of Perceived Exertion (Exercise) 12    Duration Continue with 30 min of aerobic exercise without signs/symptoms of physical distress.    Intensity THRR unchanged      Progression   Progression Continue to progress workloads to maintain intensity without signs/symptoms of physical distress.    Average METs 3.75      Resistance Training   Training Prescription Yes    Weight 3 lb    Reps 10-15      NuStep   Level 3    Minutes 15    METs 3      Track   Laps 51    Minutes 15    METs 4.5             Nutrition:  Target Goals: Understanding of nutrition guidelines, daily intake of sodium 1500mg , cholesterol 200mg , calories 30% from fat and 7% or less from saturated fats, daily to have 5 or more servings of fruits and vegetables.  Education: All About Nutrition: -Group instruction provided by verbal, written material, interactive activities, discussions, models, and posters to present general guidelines for heart healthy nutrition including fat, fiber, MyPlate, the role of sodium in heart healthy nutrition, utilization of the nutrition label, and utilization of this knowledge for meal planning. Follow up email sent as well. Written material given at graduation.   Biometrics:    Nutrition Therapy Plan and  Nutrition Goals:  Nutrition Therapy & Goals - 07/03/20 1334       Personal Nutrition Goals   Comments He will normally eat 2x/day B: tsp of butter and brown sugar - rinsed out peaches on oatmeal L/D: [employer] or [employer] bowls with cheese, vegetables and meat or steak (no vegetables with steaks)- $35 steaks for $10 S: oatmeal cookie. He reports lowered appetite. Weight stable - about 205lbs. He reports not being able to sleep well right now and he is tired all the time. He reports being lower income.             Nutrition Assessments:  MEDIFICTS Score Key: ?70 Need to make dietary changes  40-70 Heart Healthy Diet ? 40 Therapeutic Level Cholesterol Diet  Flowsheet Row Pulmonary Rehab from 06/27/2020 in [facility] Cardiac and Pulmonary Rehab  Picture Your Plate Total Score on Admission 31      Picture Your Plate Scores: <96 Unhealthy dietary pattern with much room for improvement. 41-50 Dietary pattern unlikely to meet recommendations for good health and room for improvement. 51-60 More healthful dietary pattern, with some room for improvement.  >60 Healthy dietary pattern, although  there may be some specific behaviors that could be improved.   Nutrition Goals Re-Evaluation:   Nutrition Goals Discharge (Final Nutrition Goals Re-Evaluation):   Psychosocial: Target Goals: Acknowledge presence or absence of significant depression and/or stress, maximize coping skills, provide positive support system. Participant is able to verbalize types and ability to use techniques and skills needed for reducing stress and depression.   Education: Stress, Anxiety, and Depression - Group verbal and visual presentation to define topics covered.  Reviews how body is impacted by stress, anxiety, and depression.  Also discusses healthy ways to reduce stress and to treat/manage anxiety and depression.  Written material given at graduation. Flowsheet Row Pulmonary Rehab from 08/14/2020 in [facility] Cardiac and  Pulmonary Rehab  Date 08/14/20  Educator AS  Instruction Review Code 1- Verbalizes Understanding       Education: Sleep Hygiene -Provides group verbal and written instruction about how sleep can affect your health.  Define sleep hygiene, discuss sleep cycles and impact of sleep habits. Review good sleep hygiene tips.    Initial Review & Psychosocial Screening:   Quality of Life Scores:  Scores of 19 and below usually indicate a poorer quality of life in these areas.  A difference of  2-3 points is a clinically meaningful difference.  A difference of 2-3 points in the total score of the Quality of Life Index has been associated with significant improvement in overall quality of life, self-image, physical symptoms, and general health in studies assessing change in quality of life.  PHQ-9: Recent Review Flowsheet Data     Depression screen [facility] 2/9 06/27/2020 08/02/2018 07/05/2018 11/17/2016   Decreased Interest 1 0 0 0   Down, Depressed, Hopeless 0 0 0 0   PHQ - 2 Score 1 0 0 0   Altered sleeping 2  - - 0   Tired, decreased energy 2 - - 1   Change in appetite 0 - - 0   Feeling bad or failure about yourself  0 - - 0   Trouble concentrating 0 - - 0   Moving slowly or fidgety/restless 2 - - 0   Suicidal thoughts 0 - - 0   PHQ-9 Score 7 - - 1   Difficult doing work/chores - - - Somewhat difficult      Interpretation of Total Score  Total Score Depression Severity:  1-4 = Minimal depression, 5-9 = Mild depression, 10-14 = Moderate depression, 15-19 = Moderately severe depression, 20-27 = Severe depression   Psychosocial Evaluation and Intervention:   Psychosocial Re-Evaluation:   Psychosocial Discharge (Final Psychosocial Re-Evaluation):   Education: Education Goals: Education classes will be provided on a weekly basis, covering required topics. Participant will state understanding/return demonstration of topics presented.  Learning Barriers/Preferences:   General  Pulmonary Education Topics:  Infection Prevention: - Provides verbal and written material to individual with discussion of infection control including proper hand washing and proper equipment cleaning during exercise session. Flowsheet Row Pulmonary Rehab from 08/14/2020 in [facility] Cardiac and Pulmonary Rehab  Date 06/27/20  Educator AS  Instruction Review Code 1- Verbalizes Understanding       Falls Prevention: - Provides verbal and written material to individual with discussion of falls prevention and safety. Flowsheet Row Pulmonary Rehab from 08/14/2020 in [facility] Cardiac and Pulmonary Rehab  Date 06/27/20  Educator AS  Instruction Review Code 1- Verbalizes Understanding       Chronic Lung Disease Review: - Group verbal instruction with posters, models, PowerPoint presentations and videos,  to  review new updates, new respiratory medications, new advancements in procedures and treatments. Providing information on websites and "800" numbers for continued self-education. Includes information about supplement oxygen, available portable oxygen systems, continuous and intermittent flow rates, oxygen safety, concentrators, and Medicare reimbursement for oxygen. Explanation of Pulmonary Drugs, including class, frequency, complications, importance of spacers, rinsing mouth after steroid MDI's, and proper cleaning methods for nebulizers. Review of basic lung anatomy and physiology related to function, structure, and complications of lung disease. Review of risk factors. Discussion about methods for diagnosing sleep apnea and types of masks and machines for OSA. Includes a review of the use of types of environmental controls: home humidity, furnaces, filters, dust mite/pet prevention, HEPA vacuums. Discussion about weather changes, air quality and the benefits of nasal washing. Instruction on Warning signs, infection symptoms, calling MD promptly, preventive modes, and value of vaccinations. Review of effective  airway clearance, coughing and/or vibration techniques. Emphasizing that all should Create an Action Plan. Written material given at graduation. Flowsheet Row Pulmonary Rehab from 12/09/2016 in [facility] Cardiac and Pulmonary Rehab  Date 12/09/16  Educator [facility]  Instruction Review Code 1- Verbalizes Understanding       AED/CPR: - Group verbal and written instruction with the use of models to demonstrate the basic use of the AED with the basic ABC's of resuscitation.    Anatomy and Cardiac Procedures: - Group verbal and visual presentation and models provide information about basic cardiac anatomy and function. Reviews the testing methods done to diagnose heart disease and the outcomes of the test results. Describes the treatment choices: Medical Management, Angioplasty, or Coronary Bypass Surgery for treating various heart conditions including Myocardial Infarction, Angina, Valve Disease, and Cardiac Arrhythmias.  Written material given at graduation. Flowsheet Row Pulmonary Rehab from 08/14/2020 in [facility] Cardiac and Pulmonary Rehab  Date 07/03/20  Educator [facility]  Instruction Review Code 1- Verbalizes Understanding       Medication Safety: - Group verbal and visual instruction to review commonly prescribed medications for heart and lung disease. Reviews the medication, class of the drug, and side effects. Includes the steps to properly store meds and maintain the prescription regimen.  Written material given at graduation. Flowsheet Row Pulmonary Rehab from 08/14/2020 in [facility] Cardiac and Pulmonary Rehab  Date 07/24/20  Educator SB  Instruction Review Code 1- Verbalizes Understanding       Other: -Provides group and verbal instruction on various topics (see comments)   Knowledge Questionnaire Score:  Knowledge Questionnaire Score - 06/27/20 1534       Knowledge Questionnaire Score   Pre Score 15/18              Core Components/Risk Factors/Patient Goals at Admission:  Personal  Goals and Risk Factors at Admission - 06/27/20 1539       Core Components/Risk Factors/Patient Goals on Admission    Weight Management Yes;Weight Loss    Intervention Weight Management: Develop a combined nutrition and exercise program designed to reach desired caloric intake, while maintaining appropriate intake of nutrient and fiber, sodium and fats, and appropriate energy expenditure required for the weight goal.;Weight Management: Provide education and appropriate resources to help participant work on and attain dietary goals.;Weight Management/Obesity: Establish reasonable short term and long term weight goals.    Expected Outcomes Long Term: Adherence to nutrition and physical activity/exercise program aimed toward attainment of established weight goal;Short Term: Continue to assess and modify interventions until short term weight is achieved;Weight Loss: Understanding of general recommendations for a balanced deficit  meal plan, which promotes 1-2 lb weight loss per week and includes a negative energy balance of [phone] kcal/d;Understanding recommendations for meals to include 15-35% energy as protein, 25-35% energy from fat, 35-60% energy from carbohydrates, less than 200mg  of dietary cholesterol, 20-35 gm of total fiber daily;Understanding of distribution of calorie intake throughout the day with the consumption of 4-5 meals/snacks    Intervention Provide education, individualized exercise plan and daily activity instruction to help decrease symptoms of SOB with activities of daily living.    Expected Outcomes Short Term: Improve cardiorespiratory fitness to achieve a reduction of symptoms when performing ADLs;Long Term: Be able to perform more ADLs without symptoms or delay the onset of symptoms    Hypertension Yes    Intervention Provide education on lifestyle modifcations including regular physical activity/exercise, weight management, moderate sodium restriction and increased consumption of  fresh fruit, vegetables, and low fat dairy, alcohol moderation, and smoking cessation.;Monitor prescription use compliance.    Expected Outcomes Short Term: Continued assessment and intervention until BP is < 140/62mm HG in hypertensive participants. < 130/74mm HG in hypertensive participants with diabetes, heart failure or chronic kidney disease.;Long Term: Maintenance of blood pressure at goal levels.    Lipids Yes    Intervention Provide education and support for participant on nutrition & aerobic/resistive exercise along with prescribed medications to achieve LDL 70mg , HDL >40mg .    Expected Outcomes Short Term: Participant states understanding of desired cholesterol values and is compliant with medications prescribed. Participant is following exercise prescription and nutrition guidelines.;Long Term: Cholesterol controlled with medications as prescribed, with individualized exercise RX and with personalized nutrition plan. Value goals: LDL < 70mg , HDL > 40 mg.             Education:Diabetes - Individual verbal and written instruction to review signs/symptoms of diabetes, desired ranges of glucose level fasting, after meals and with exercise. Acknowledge that pre and post exercise glucose checks will be done for 3 sessions at entry of program.   Know Your Numbers and Heart Failure: - Group verbal and visual instruction to discuss disease risk factors for cardiac and pulmonary disease and treatment options.  Reviews associated critical values for Overweight/Obesity, Hypertension, Cholesterol, and Diabetes.  Discusses basics of heart failure: signs/symptoms and treatments.  Introduces Heart Failure Zone chart for action plan for heart failure.  Written material given at graduation. Flowsheet Row Pulmonary Rehab from 08/14/2020 in [facility] Cardiac and Pulmonary Rehab  Date 07/31/20  Educator [facility]  Instruction Review Code 1- Verbalizes Understanding       Core Components/Risk Factors/Patient Goals  Review:    Core Components/Risk Factors/Patient Goals at Discharge (Final Review):    ITP Comments:  ITP Comments     Row Name 07/01/20 1411 07/24/20 0609 08/15/20 1345 08/21/20 1140     ITP Comments First full day of exercise!  Patient was oriented to gym and equipment including functions, settings, policies, and procedures.  Patient's individual exercise prescription and treatment plan were reviewed.  All starting workloads were established based on the results of the 6 minute walk test done at initial orientation visit.  The plan for exercise progression was also introduced and progression will be customized based on patient's performance and goals. 30 Day review completed. Medical Director ITP review done, changes made as directed, and signed approval by Medical Director.   only 2 visits in May Pt called to withdraw from program due to insurance issues/past billing amounts due DIscharged  patient request  Comments: discharge ITP

## 2020-09-28 ENCOUNTER — Emergency Department: Payer: No Typology Code available for payment source

## 2020-09-28 ENCOUNTER — Other Ambulatory Visit: Payer: Self-pay

## 2020-09-28 ENCOUNTER — Emergency Department
Admission: EM | Admit: 2020-09-28 | Discharge: 2020-09-29 | Disposition: A | Discharge status: Home / Self Care | Payer: No Typology Code available for payment source | Attending: Emergency Medicine | Admitting: Emergency Medicine

## 2020-09-28 DIAGNOSIS — S92244A Nondisplaced fracture of medial cuneiform of right foot, initial encounter for closed fracture: Secondary | ICD-10-CM

## 2020-09-28 DIAGNOSIS — S92001A Unspecified fracture of right calcaneus, initial encounter for closed fracture: Secondary | ICD-10-CM

## 2020-09-28 DIAGNOSIS — S92251A Displaced fracture of navicular [scaphoid] of right foot, initial encounter for closed fracture: Secondary | ICD-10-CM | POA: Diagnosis not present

## 2020-09-28 DIAGNOSIS — Y9355 Activity, bike riding: Secondary | ICD-10-CM | POA: Insufficient documentation

## 2020-09-28 DIAGNOSIS — S92014A Nondisplaced fracture of body of right calcaneus, initial encounter for closed fracture: Secondary | ICD-10-CM | POA: Insufficient documentation

## 2020-09-28 DIAGNOSIS — S62515A Nondisplaced fracture of proximal phalanx of left thumb, initial encounter for closed fracture: Secondary | ICD-10-CM | POA: Diagnosis not present

## 2020-09-28 DIAGNOSIS — Z79899 Other long term (current) drug therapy: Secondary | ICD-10-CM | POA: Insufficient documentation

## 2020-09-28 DIAGNOSIS — Z7951 Long term (current) use of inhaled steroids: Secondary | ICD-10-CM | POA: Insufficient documentation

## 2020-09-28 DIAGNOSIS — S62025A Nondisplaced fracture of middle third of navicular [scaphoid] bone of left wrist, initial encounter for closed fracture: Secondary | ICD-10-CM | POA: Insufficient documentation

## 2020-09-28 DIAGNOSIS — I10 Essential (primary) hypertension: Secondary | ICD-10-CM | POA: Diagnosis not present

## 2020-09-28 DIAGNOSIS — Z87891 Personal history of nicotine dependence: Secondary | ICD-10-CM | POA: Insufficient documentation

## 2020-09-28 DIAGNOSIS — S92354A Nondisplaced fracture of fifth metatarsal bone, right foot, initial encounter for closed fracture: Secondary | ICD-10-CM | POA: Diagnosis not present

## 2020-09-28 DIAGNOSIS — S6992XA Unspecified injury of left wrist, hand and finger(s), initial encounter: Secondary | ICD-10-CM | POA: Diagnosis present

## 2020-09-28 DIAGNOSIS — J449 Chronic obstructive pulmonary disease, unspecified: Secondary | ICD-10-CM | POA: Insufficient documentation

## 2020-09-28 DIAGNOSIS — Z85528 Personal history of other malignant neoplasm of kidney: Secondary | ICD-10-CM | POA: Insufficient documentation

## 2020-09-28 DIAGNOSIS — Y92410 Unspecified street and highway as the place of occurrence of the external cause: Secondary | ICD-10-CM | POA: Diagnosis not present

## 2020-09-28 DIAGNOSIS — S62525A Nondisplaced fracture of distal phalanx of left thumb, initial encounter for closed fracture: Secondary | ICD-10-CM

## 2020-09-28 DIAGNOSIS — I2510 Atherosclerotic heart disease of native coronary artery without angina pectoris: Secondary | ICD-10-CM | POA: Diagnosis not present

## 2020-09-28 DIAGNOSIS — T1490XA Injury, unspecified, initial encounter: Secondary | ICD-10-CM

## 2020-09-28 LAB — BASIC METABOLIC PANEL
Anion gap: 4 — ABNORMAL LOW (ref 5–15)
BUN: 20 mg/dL (ref 8–23)
CO2: 20 mmol/L — ABNORMAL LOW (ref 22–32)
Calcium: 8.7 mg/dL — ABNORMAL LOW (ref 8.9–10.3)
Chloride: 115 mmol/L — ABNORMAL HIGH (ref 98–111)
Creatinine, Ser: 1.4 mg/dL — ABNORMAL HIGH (ref 0.61–1.24)
GFR, Estimated: 55 mL/min — ABNORMAL LOW (ref >60)
Glucose, Bld: 99 mg/dL (ref 70–99)
Potassium: 4.1 mmol/L (ref 3.5–5.1)
Sodium: 139 mmol/L (ref 135–145)

## 2020-09-28 LAB — CBC WITH DIFFERENTIAL/PLATELET
Abs Immature Granulocytes: 0.04 10*3/uL (ref 0.00–0.07)
Basophils Absolute: 0 10*3/uL (ref 0.0–0.1)
Basophils Relative: 0 %
Eosinophils Absolute: 0.3 10*3/uL (ref 0.0–0.5)
Eosinophils Relative: 3 %
HCT: 42 % (ref 39.0–52.0)
Hemoglobin: 14.4 g/dL (ref 13.0–17.0)
Immature Granulocytes: 0 %
Lymphocytes Relative: 12 %
Lymphs Abs: 1.5 10*3/uL (ref 0.7–4.0)
MCH: 33.7 pg (ref 26.0–34.0)
MCHC: 34.3 g/dL (ref 30.0–36.0)
MCV: 98.4 fL (ref 80.0–100.0)
Monocytes Absolute: 0.8 10*3/uL (ref 0.1–1.0)
Monocytes Relative: 6 %
Neutro Abs: 9.3 10*3/uL — ABNORMAL HIGH (ref 1.7–7.7)
Neutrophils Relative %: 79 %
Platelets: 206 10*3/uL (ref 150–400)
RBC: 4.27 MIL/uL (ref 4.22–5.81)
RDW: 13.2 % (ref 11.5–15.5)
WBC: 11.9 10*3/uL — ABNORMAL HIGH (ref 4.0–10.5)
nRBC: 0 % (ref 0.0–0.2)

## 2020-09-28 MED ORDER — OXYCODONE-ACETAMINOPHEN 5-325 MG PO TABS: 1.0000 | ORAL_TABLET | ORAL | 0 refills | Status: DC | PRN

## 2020-09-28 MED ORDER — FENTANYL CITRATE (PF) 100 MCG/2ML IJ SOLN: 100.0000 ug | Freq: Once | INTRAMUSCULAR | Status: DC

## 2020-09-28 MED ORDER — MORPHINE SULFATE (PF) 4 MG/ML IV SOLN
4.0000 mg | Freq: Once | INTRAVENOUS | Status: AC
  Administered 2020-09-28: 4 mg via INTRAVENOUS
  Filled 2020-09-28: qty 1

## 2020-09-28 MED ORDER — FENTANYL CITRATE (PF) 100 MCG/2ML IJ SOLN
50.0000 ug | Freq: Once | INTRAMUSCULAR | Status: AC
  Administered 2020-09-28: 50 ug via INTRAVENOUS
  Filled 2020-09-28: qty 2

## 2020-09-28 MED ORDER — IOHEXOL 350 MG/ML SOLN
100.0000 mL | Freq: Once | INTRAVENOUS | Status: AC | PRN
  Administered 2020-09-28: 100 mL via INTRAVENOUS
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
## 2020-09-28 NOTE — Discharge Instructions (Signed)
It is extremely important that you follow up closely with orthopedics. I have give you the contact information for the orthopedic surgeon I spoke to tonight, please give them a call Monday. However, if you chose to get your care at the [facility] please be sure to contact that clinic Monday as well. If you notice any severe headache, shortness of breath, persistent vomiting or any other new or concerning symptoms please seek medical attention.
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
## 2020-09-29 ENCOUNTER — Other Ambulatory Visit: Payer: Self-pay

## 2020-09-29 LAB — TROPONIN I (HIGH SENSITIVITY): Troponin I (High Sensitivity): 6 ng/L (ref <18)

## 2020-09-29 MED ORDER — OXYCODONE-ACETAMINOPHEN 5-325 MG PO TABS
1.0000 | ORAL_TABLET | Freq: Once | ORAL | Status: AC
  Administered 2020-09-29: 1 via ORAL
  Filled 2020-09-29: qty 1

## 2020-09-29 NOTE — ED Notes (Signed)
E-signature pad unavailable - Pt & Spouse verbalized understanding of D/C information - no additional concerns at this time.  

## 2020-09-29 NOTE — ED Notes (Signed)
Pt was assisted to the wheelchair by this RN - Pt endorsed CP and fatigue. RN Obtained VS & notified MD. EKG obtained.

## 2020-09-30 ENCOUNTER — Other Ambulatory Visit: Payer: Self-pay | Admitting: Podiatry

## 2020-09-30 ENCOUNTER — Ambulatory Visit: Payer: Non-veteran care

## 2020-09-30 DIAGNOSIS — S92251A Displaced fracture of navicular [scaphoid] of right foot, initial encounter for closed fracture: Secondary | ICD-10-CM

## 2020-10-01 ENCOUNTER — Other Ambulatory Visit: Payer: Self-pay | Admitting: Podiatry

## 2020-10-01 ENCOUNTER — Ambulatory Visit
Admission: RE | Admit: 2020-10-01 | Discharge: 2020-10-01 | Disposition: A | Discharge status: Home / Self Care | Payer: Medicare Other | Source: Ambulatory Visit | Attending: Podiatry | Admitting: Podiatry

## 2020-10-01 ENCOUNTER — Other Ambulatory Visit: Payer: Self-pay

## 2020-10-01 DIAGNOSIS — S92251A Displaced fracture of navicular [scaphoid] of right foot, initial encounter for closed fracture: Secondary | ICD-10-CM | POA: Diagnosis not present

## 2020-10-02 ENCOUNTER — Other Ambulatory Visit: Payer: Self-pay | Admitting: Surgery

## 2020-10-02 ENCOUNTER — Inpatient Hospital Stay: Admission: RE | Admit: 2020-10-02 | Payer: Medicare Other | Source: Ambulatory Visit

## 2020-10-03 ENCOUNTER — Observation Stay
Admission: RE | Admit: 2020-10-03 | Discharge: 2020-10-05 | Disposition: A | Discharge status: Home / Self Care | Payer: Medicare Other | Attending: Surgery | Admitting: Surgery

## 2020-10-03 ENCOUNTER — Ambulatory Visit: Payer: Medicare Other | Admitting: Urgent Care

## 2020-10-03 ENCOUNTER — Ambulatory Visit: Payer: Medicare Other

## 2020-10-03 ENCOUNTER — Encounter
Admission: RE | Disposition: A | Discharge status: Home / Self Care | Payer: Self-pay | Source: Home / Self Care | Attending: Surgery

## 2020-10-03 ENCOUNTER — Encounter: Payer: Self-pay | Admitting: Surgery

## 2020-10-03 ENCOUNTER — Other Ambulatory Visit: Payer: Self-pay

## 2020-10-03 DIAGNOSIS — S92251A Displaced fracture of navicular [scaphoid] of right foot, initial encounter for closed fracture: Secondary | ICD-10-CM | POA: Insufficient documentation

## 2020-10-03 DIAGNOSIS — S62002A Unspecified fracture of navicular [scaphoid] bone of left wrist, initial encounter for closed fracture: Secondary | ICD-10-CM | POA: Diagnosis not present

## 2020-10-03 DIAGNOSIS — S92351A Displaced fracture of fifth metatarsal bone, right foot, initial encounter for closed fracture: Secondary | ICD-10-CM | POA: Diagnosis not present

## 2020-10-03 DIAGNOSIS — S62525A Nondisplaced fracture of distal phalanx of left thumb, initial encounter for closed fracture: Secondary | ICD-10-CM | POA: Diagnosis not present

## 2020-10-03 DIAGNOSIS — Z79899 Other long term (current) drug therapy: Secondary | ICD-10-CM | POA: Diagnosis not present

## 2020-10-03 DIAGNOSIS — Z20822 Contact with and (suspected) exposure to covid-19: Secondary | ICD-10-CM | POA: Diagnosis not present

## 2020-10-03 DIAGNOSIS — R2681 Unsteadiness on feet: Secondary | ICD-10-CM | POA: Insufficient documentation

## 2020-10-03 DIAGNOSIS — S92252A Displaced fracture of navicular [scaphoid] of left foot, initial encounter for closed fracture: Secondary | ICD-10-CM

## 2020-10-03 DIAGNOSIS — S92001A Unspecified fracture of right calcaneus, initial encounter for closed fracture: Secondary | ICD-10-CM | POA: Diagnosis not present

## 2020-10-03 DIAGNOSIS — S92012A Displaced fracture of body of left calcaneus, initial encounter for closed fracture: Secondary | ICD-10-CM

## 2020-10-03 DIAGNOSIS — Y92410 Unspecified street and highway as the place of occurrence of the external cause: Secondary | ICD-10-CM | POA: Diagnosis not present

## 2020-10-03 DIAGNOSIS — S62009A Unspecified fracture of navicular [scaphoid] bone of unspecified wrist, initial encounter for closed fracture: Secondary | ICD-10-CM | POA: Diagnosis present

## 2020-10-03 DIAGNOSIS — S6992XA Unspecified injury of left wrist, hand and finger(s), initial encounter: Secondary | ICD-10-CM | POA: Diagnosis present

## 2020-10-03 HISTORY — PX: ORIF SCAPHOID FRACTURE: SHX2130

## 2020-10-03 LAB — SARS CORONAVIRUS 2 BY RT PCR (HOSPITAL ORDER, PERFORMED IN ~~LOC~~ HOSPITAL LAB): SARS Coronavirus 2: NEGATIVE

## 2020-10-03 SURGERY — OPEN REDUCTION INTERNAL FIXATION (ORIF) SCAPHOID FRACTURE
Anesthesia: General | Site: Wrist | Laterality: Left

## 2020-10-03 MED ORDER — 0.9 % SODIUM CHLORIDE (POUR BTL) OPTIME
TOPICAL | Status: DC | PRN
  Administered 2020-10-03: 500 mL

## 2020-10-03 MED ORDER — CEFAZOLIN SODIUM-DEXTROSE 2-4 GM/100ML-% IV SOLN
INTRAVENOUS | Status: AC
  Filled 2020-10-03: qty 100

## 2020-10-03 MED ORDER — EPHEDRINE SULFATE 50 MG/ML IJ SOLN
INTRAMUSCULAR | Status: DC | PRN
Start: 2020-10-03 — End: 2020-10-03
  Administered 2020-10-03 (×2): 5 mg via INTRAVENOUS

## 2020-10-03 MED ORDER — CHLORHEXIDINE GLUCONATE 0.12 % MT SOLN
15.0000 mL | Freq: Once | OROMUCOSAL | Status: AC
  Administered 2020-10-03: 15 mL via OROMUCOSAL

## 2020-10-03 MED ORDER — VASOPRESSIN 20 UNIT/ML IV SOLN
INTRAVENOUS | Status: DC | PRN
  Administered 2020-10-03: 2 [IU] via INTRAVENOUS

## 2020-10-03 MED ORDER — PHENYLEPHRINE HCL (PRESSORS) 10 MG/ML IV SOLN
INTRAVENOUS | Status: AC
  Filled 2020-10-03: qty 1

## 2020-10-03 MED ORDER — DOCUSATE SODIUM 100 MG PO CAPS
100.0000 mg | ORAL_CAPSULE | Freq: Two times a day (BID) | ORAL | Status: DC
  Administered 2020-10-03 – 2020-10-05 (×4): 100 mg via ORAL
  Filled 2020-10-03 (×4): qty 1

## 2020-10-03 MED ORDER — HYDROCODONE-ACETAMINOPHEN 7.5-325 MG PO TABS: 1.0000 | ORAL_TABLET | Freq: Once | ORAL | Status: DC | PRN

## 2020-10-03 MED ORDER — PHENYLEPHRINE HCL (PRESSORS) 10 MG/ML IV SOLN
INTRAVENOUS | Status: DC | PRN
  Administered 2020-10-03: 100 ug via INTRAVENOUS
  Administered 2020-10-03: 200 ug via INTRAVENOUS
  Administered 2020-10-03 (×2): 100 ug via INTRAVENOUS

## 2020-10-03 MED ORDER — LISINOPRIL 10 MG PO TABS
10.0000 mg | ORAL_TABLET | Freq: Every day | ORAL | Status: DC
  Administered 2020-10-03 – 2020-10-05 (×2): 10 mg via ORAL
  Filled 2020-10-03 (×3): qty 1

## 2020-10-03 MED ORDER — SODIUM CHLORIDE 0.9 % IV SOLN: INTRAVENOUS | Status: DC

## 2020-10-03 MED ORDER — BUPIVACAINE HCL (PF) 0.5 % IJ SOLN
INTRAMUSCULAR | Status: AC
  Filled 2020-10-03: qty 30

## 2020-10-03 MED ORDER — TOPIRAMATE 100 MG PO TABS
100.0000 mg | ORAL_TABLET | Freq: Every day | ORAL | Status: DC
  Administered 2020-10-03 – 2020-10-04 (×2): 100 mg via ORAL
  Filled 2020-10-03 (×3): qty 1

## 2020-10-03 MED ORDER — FENTANYL CITRATE (PF) 100 MCG/2ML IJ SOLN
INTRAMUSCULAR | Status: DC | PRN
  Administered 2020-10-03: 25 ug via INTRAVENOUS
  Administered 2020-10-03: 50 ug via INTRAVENOUS

## 2020-10-03 MED ORDER — LIDOCAINE HCL (CARDIAC) PF 100 MG/5ML IV SOSY
PREFILLED_SYRINGE | INTRAVENOUS | Status: DC | PRN
  Administered 2020-10-03: 100 mg via INTRAVENOUS

## 2020-10-03 MED ORDER — FLUTICASONE PROPIONATE 50 MCG/ACT NA SUSP
2.0000 | Freq: Two times a day (BID) | NASAL | Status: DC
  Administered 2020-10-03 – 2020-10-05 (×4): 2 via NASAL
  Filled 2020-10-03: qty 16

## 2020-10-03 MED ORDER — ORAL CARE MOUTH RINSE: 15.0000 mL | Freq: Once | OROMUCOSAL | Status: AC

## 2020-10-03 MED ORDER — LACTATED RINGERS IV SOLN: INTRAVENOUS | Status: DC

## 2020-10-03 MED ORDER — ACETAMINOPHEN 500 MG PO TABS
1000.0000 mg | ORAL_TABLET | Freq: Four times a day (QID) | ORAL | Status: AC
  Administered 2020-10-03 – 2020-10-04 (×2): 1000 mg via ORAL
  Filled 2020-10-03 (×3): qty 2

## 2020-10-03 MED ORDER — HYDROMORPHONE HCL 1 MG/ML IJ SOLN: 0.5000 mg | INTRAMUSCULAR | Status: DC | PRN

## 2020-10-03 MED ORDER — KETOROLAC TROMETHAMINE 15 MG/ML IJ SOLN
15.0000 mg | Freq: Once | INTRAMUSCULAR | Status: AC
  Administered 2020-10-03: 15 mg via INTRAVENOUS
  Filled 2020-10-03: qty 1

## 2020-10-03 MED ORDER — METOCLOPRAMIDE HCL 5 MG/ML IJ SOLN: 5.0000 mg | Freq: Three times a day (TID) | INTRAMUSCULAR | Status: DC | PRN

## 2020-10-03 MED ORDER — ONDANSETRON HCL 4 MG PO TABS: 4.0000 mg | ORAL_TABLET | Freq: Four times a day (QID) | ORAL | Status: DC | PRN

## 2020-10-03 MED ORDER — BUDESONIDE 0.5 MG/2ML IN SUSP
0.5000 mg | Freq: Two times a day (BID) | RESPIRATORY_TRACT | Status: DC
  Administered 2020-10-03 – 2020-10-05 (×4): 0.5 mg via RESPIRATORY_TRACT
  Filled 2020-10-03 (×4): qty 2

## 2020-10-03 MED ORDER — CEFAZOLIN SODIUM-DEXTROSE 2-4 GM/100ML-% IV SOLN
2.0000 g | Freq: Four times a day (QID) | INTRAVENOUS | Status: AC
  Administered 2020-10-03 – 2020-10-04 (×3): 2 g via INTRAVENOUS
  Filled 2020-10-03 (×4): qty 100

## 2020-10-03 MED ORDER — PROPOFOL 10 MG/ML IV BOLUS
INTRAVENOUS | Status: DC | PRN
  Administered 2020-10-03: 200 mg via INTRAVENOUS

## 2020-10-03 MED ORDER — BENZONATATE 100 MG PO CAPS: 100.0000 mg | ORAL_CAPSULE | Freq: Three times a day (TID) | ORAL | Status: DC | PRN

## 2020-10-03 MED ORDER — ONDANSETRON HCL 4 MG/2ML IJ SOLN
INTRAMUSCULAR | Status: DC | PRN
Start: 2020-10-03 — End: 2020-10-03
  Administered 2020-10-03: 4 mg via INTRAVENOUS

## 2020-10-03 MED ORDER — ACETAMINOPHEN 10 MG/ML IV SOLN
INTRAVENOUS | Status: AC
  Filled 2020-10-03: qty 100

## 2020-10-03 MED ORDER — POVIDONE-IODINE 7.5 % EX SOLN
Freq: Once | CUTANEOUS | Status: AC
  Administered 2020-10-03: 1 via TOPICAL
  Filled 2020-10-03: qty 118

## 2020-10-03 MED ORDER — FENTANYL CITRATE (PF) 100 MCG/2ML IJ SOLN
INTRAMUSCULAR | Status: AC
  Filled 2020-10-03: qty 2

## 2020-10-03 MED ORDER — CEFAZOLIN SODIUM-DEXTROSE 2-4 GM/100ML-% IV SOLN
2.0000 g | INTRAVENOUS | Status: AC
  Administered 2020-10-03: 2 g via INTRAVENOUS

## 2020-10-03 MED ORDER — UMECLIDINIUM BROMIDE 62.5 MCG/INH IN AEPB
1.0000 | INHALATION_SPRAY | Freq: Every day | RESPIRATORY_TRACT | Status: DC
  Administered 2020-10-04 – 2020-10-05 (×2): 1 via RESPIRATORY_TRACT
  Filled 2020-10-03: qty 7

## 2020-10-03 MED ORDER — ACETAMINOPHEN 10 MG/ML IV SOLN
INTRAVENOUS | Status: DC | PRN
  Administered 2020-10-03: 1000 mg via INTRAVENOUS

## 2020-10-03 MED ORDER — PANTOPRAZOLE SODIUM 40 MG PO TBEC
40.0000 mg | DELAYED_RELEASE_TABLET | Freq: Every day | ORAL | Status: DC
  Administered 2020-10-04 – 2020-10-05 (×2): 40 mg via ORAL
  Filled 2020-10-03 (×2): qty 1

## 2020-10-03 MED ORDER — MIDAZOLAM HCL 2 MG/2ML IJ SOLN
INTRAMUSCULAR | Status: AC
  Filled 2020-10-03: qty 2

## 2020-10-03 MED ORDER — DIPHENHYDRAMINE HCL 12.5 MG/5ML PO ELIX: 12.5000 mg | ORAL_SOLUTION | ORAL | Status: DC | PRN

## 2020-10-03 MED ORDER — ALBUTEROL SULFATE (2.5 MG/3ML) 0.083% IN NEBU: 2.5000 mg | INHALATION_SOLUTION | Freq: Four times a day (QID) | RESPIRATORY_TRACT | Status: DC | PRN

## 2020-10-03 MED ORDER — TERAZOSIN HCL 5 MG PO CAPS
5.0000 mg | ORAL_CAPSULE | Freq: Every day | ORAL | Status: DC
  Administered 2020-10-03 – 2020-10-04 (×2): 5 mg via ORAL
  Filled 2020-10-03 (×3): qty 1

## 2020-10-03 MED ORDER — METOCLOPRAMIDE HCL 10 MG PO TABS: 5.0000 mg | ORAL_TABLET | Freq: Three times a day (TID) | ORAL | Status: DC | PRN

## 2020-10-03 MED ORDER — ACETAMINOPHEN 325 MG PO TABS: 325.0000 mg | ORAL_TABLET | Freq: Four times a day (QID) | ORAL | Status: DC | PRN

## 2020-10-03 MED ORDER — ALBUTEROL SULFATE HFA 108 (90 BASE) MCG/ACT IN AERS
2.0000 | INHALATION_SPRAY | Freq: Four times a day (QID) | RESPIRATORY_TRACT | Status: DC | PRN
  Filled 2020-10-03 (×2): qty 6.7

## 2020-10-03 MED ORDER — CEFAZOLIN SODIUM-DEXTROSE 2-4 GM/100ML-% IV SOLN
2.0000 g | INTRAVENOUS | Status: AC
  Administered 2020-10-04: 2 g via INTRAVENOUS

## 2020-10-03 MED ORDER — METHOCARBAMOL 500 MG PO TABS
750.0000 mg | ORAL_TABLET | Freq: Four times a day (QID) | ORAL | Status: DC | PRN
  Administered 2020-10-03 – 2020-10-05 (×2): 750 mg via ORAL
  Filled 2020-10-03 (×2): qty 2

## 2020-10-03 MED ORDER — ADULT MULTIVITAMIN W/MINERALS CH
1.0000 | ORAL_TABLET | Freq: Every day | ORAL | Status: DC
  Administered 2020-10-03 – 2020-10-05 (×3): 1 via ORAL
  Filled 2020-10-03 (×3): qty 1

## 2020-10-03 MED ORDER — ARFORMOTEROL TARTRATE 15 MCG/2ML IN NEBU
15.0000 ug | INHALATION_SOLUTION | Freq: Two times a day (BID) | RESPIRATORY_TRACT | Status: DC
  Administered 2020-10-03 – 2020-10-05 (×4): 15 ug via RESPIRATORY_TRACT
  Filled 2020-10-03 (×5): qty 2

## 2020-10-03 MED ORDER — FLEET ENEMA 7-19 GM/118ML RE ENEM: 1.0000 | ENEMA | Freq: Once | RECTAL | Status: DC | PRN

## 2020-10-03 MED ORDER — ONDANSETRON HCL 4 MG/2ML IJ SOLN: 4.0000 mg | Freq: Four times a day (QID) | INTRAMUSCULAR | Status: DC | PRN

## 2020-10-03 MED ORDER — FENTANYL CITRATE (PF) 100 MCG/2ML IJ SOLN
25.0000 ug | INTRAMUSCULAR | Status: DC | PRN
  Administered 2020-10-03: 25 ug via INTRAVENOUS

## 2020-10-03 MED ORDER — CHLORHEXIDINE GLUCONATE 0.12 % MT SOLN
OROMUCOSAL | Status: AC
  Filled 2020-10-03: qty 15

## 2020-10-03 MED ORDER — MAGNESIUM HYDROXIDE 400 MG/5ML PO SUSP: 30.0000 mL | Freq: Every day | ORAL | Status: DC | PRN

## 2020-10-03 MED ORDER — BUPIVACAINE HCL (PF) 0.5 % IJ SOLN
INTRAMUSCULAR | Status: DC | PRN
  Administered 2020-10-03: 5 mL

## 2020-10-03 MED ORDER — OXYCODONE HCL 5 MG PO TABS
5.0000 mg | ORAL_TABLET | ORAL | Status: DC | PRN
  Administered 2020-10-03 – 2020-10-05 (×3): 10 mg via ORAL
  Administered 2020-10-05: 5 mg via ORAL
  Filled 2020-10-03 (×3): qty 2
  Filled 2020-10-03: qty 1

## 2020-10-03 MED ORDER — KETOROLAC TROMETHAMINE 15 MG/ML IJ SOLN
7.5000 mg | Freq: Four times a day (QID) | INTRAMUSCULAR | Status: DC
  Administered 2020-10-03 – 2020-10-04 (×3): 7.5 mg via INTRAVENOUS
  Filled 2020-10-03 (×3): qty 1

## 2020-10-03 MED ORDER — DEXAMETHASONE SODIUM PHOSPHATE 10 MG/ML IJ SOLN
INTRAMUSCULAR | Status: DC | PRN
  Administered 2020-10-03: 10 mg via INTRAVENOUS

## 2020-10-03 MED ORDER — AMITRIPTYLINE HCL 10 MG PO TABS
20.0000 mg | ORAL_TABLET | Freq: Every day | ORAL | Status: DC
  Administered 2020-10-03 – 2020-10-04 (×2): 20 mg via ORAL
  Filled 2020-10-03 (×3): qty 2

## 2020-10-03 MED ORDER — BISACODYL 10 MG RE SUPP: 10.0000 mg | Freq: Every day | RECTAL | Status: DC | PRN

## 2020-10-03 MED ORDER — SIMVASTATIN 20 MG PO TABS
10.0000 mg | ORAL_TABLET | Freq: Every day | ORAL | Status: DC
  Administered 2020-10-03 – 2020-10-04 (×2): 10 mg via ORAL
  Filled 2020-10-03 (×2): qty 1

## 2020-10-03 MED ORDER — MIDAZOLAM HCL 2 MG/2ML IJ SOLN
INTRAMUSCULAR | Status: DC | PRN
Start: 2020-10-03 — End: 2020-10-03
  Administered 2020-10-03: 2 mg via INTRAVENOUS

## 2020-10-03 MED ORDER — GLYCOPYRROLATE 0.2 MG/ML IJ SOLN
INTRAMUSCULAR | Status: DC | PRN
Start: 2020-10-03 — End: 2020-10-03
  Administered 2020-10-03: .2 mg via INTRAVENOUS

## 2020-10-03 SURGICAL SUPPLY — 45 items
BIT DRILL 1.8 CANN MAX VPC (BIT) ×2 IMPLANT
BNDG COHESIVE 4X5 TAN STRL (GAUZE/BANDAGES/DRESSINGS) ×2 IMPLANT
BNDG ESMARK 4X12 TAN STRL LF (GAUZE/BANDAGES/DRESSINGS) ×2 IMPLANT
BUR RND POLISHING (BURR) IMPLANT
CANISTER SUCT 1200ML W/VALVE (MISCELLANEOUS) ×2 IMPLANT
CHLORAPREP W/TINT 26 (MISCELLANEOUS) ×4 IMPLANT
CORD BIP STRL DISP 12FT (MISCELLANEOUS) ×2 IMPLANT
DRAPE FLUOR MINI C-ARM 54X84 (DRAPES) ×2 IMPLANT
DRAPE ORTHO SPLIT 77X108 STRL (DRAPES) ×1
DRAPE SURG 17X11 SM STRL (DRAPES) ×2 IMPLANT
DRAPE SURG ORHT 6 SPLT 77X108 (DRAPES) ×1 IMPLANT
DRAPE U-SHAPE 47X51 STRL (DRAPES) ×2 IMPLANT
ELECT CAUTERY BLADE 6.4 (BLADE) ×2 IMPLANT
ELECT REM PT RETURN 9FT ADLT (ELECTROSURGICAL) ×2
ELECTRODE REM PT RTRN 9FT ADLT (ELECTROSURGICAL) ×1 IMPLANT
FORCEPS JEWEL BIP 4-3/4 STR (INSTRUMENTS) ×2 IMPLANT
GAUZE 4X4 16PLY ~~LOC~~+RFID DBL (SPONGE) ×2 IMPLANT
GAUZE SPONGE 4X4 12PLY STRL (GAUZE/BANDAGES/DRESSINGS) ×2 IMPLANT
GAUZE XEROFORM 1X8 LF (GAUZE/BANDAGES/DRESSINGS) ×2 IMPLANT
GLOVE SURG ENC MOIS LTX SZ8 (GLOVE) ×4 IMPLANT
GLOVE SURG UNDER LTX SZ8 (GLOVE) ×2 IMPLANT
GOWN STRL REUS W/ TWL LRG LVL3 (GOWN DISPOSABLE) ×1 IMPLANT
GOWN STRL REUS W/ TWL XL LVL3 (GOWN DISPOSABLE) ×1 IMPLANT
GOWN STRL REUS W/TWL LRG LVL3 (GOWN DISPOSABLE) ×1
GOWN STRL REUS W/TWL XL LVL3 (GOWN DISPOSABLE) ×1
K-WIRE COCR 0.9X95 (WIRE) ×2
KIT TURNOVER KIT A (KITS) ×2 IMPLANT
KWIRE COCR 0.9X95 (WIRE) ×1 IMPLANT
MANIFOLD NEPTUNE II (INSTRUMENTS) ×2 IMPLANT
NEEDLE FILTER BLUNT 18X 1/2SAF (NEEDLE) ×1
NEEDLE FILTER BLUNT 18X1 1/2 (NEEDLE) ×1 IMPLANT
NS IRRIG 500ML POUR BTL (IV SOLUTION) ×2 IMPLANT
PACK EXTREMITY ARMC (MISCELLANEOUS) ×2 IMPLANT
PAD CAST CTTN 4X4 STRL (SOFTGOODS) ×2 IMPLANT
PADDING CAST COTTON 4X4 STRL (SOFTGOODS) ×2
SCREW MAX VPC  2.5X20 (Screw) ×1 IMPLANT
SCREW MAX VPC 2.5MMX26MM (Screw) ×2 IMPLANT
SCREW MAX VPC 2.5X20 (Screw) ×1 IMPLANT
SPLINT CAST 1 STEP 4X15 (MISCELLANEOUS) ×2 IMPLANT
STAPLER SKIN PROX 35W (STAPLE) ×2 IMPLANT
STOCKINETTE IMPERVIOUS 9X36 MD (GAUZE/BANDAGES/DRESSINGS) ×2 IMPLANT
SUT PROLENE 4 0 PS 2 18 (SUTURE) ×2 IMPLANT
SUT VIC AB 2-0 SH 27 (SUTURE)
SUT VIC AB 2-0 SH 27XBRD (SUTURE) IMPLANT
SYR 10ML LL (SYRINGE) ×2 IMPLANT

## 2020-10-03 NOTE — Progress Notes (Addendum)
Pt is scheduled for right foot ORIF calcaneus and navicular.  Order placed for NPO after midnight tonight.
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
## 2020-10-03 NOTE — Anesthesia Procedure Notes (Signed)
Procedure Name: LMA Insertion Date/Time: 10/03/2020 1:55 PM Performed by: Kelton Pillar, CRNA Pre-anesthesia Checklist: Patient identified, Emergency Drugs available, Suction available and Patient being monitored Patient Re-evaluated:Patient Re-evaluated prior to induction Oxygen Delivery Method: Circle system utilized Preoxygenation: Pre-oxygenation with 100% oxygen Induction Type: IV induction LMA: LMA inserted LMA Size: 4.5 Placement Confirmation: positive ETCO2, CO2 detector and breath sounds checked- equal and bilateral Tube secured with: Tape Dental Injury: Teeth and Oropharynx as per pre-operative assessment

## 2020-10-03 NOTE — Anesthesia Preprocedure Evaluation (Signed)
Anesthesia Evaluation  Patient identified by MRN, date of birth, ID band Patient awake    Reviewed: Allergy & Precautions, NPO status , Patient's Chart, lab work & pertinent test results  Airway Mallampati: II  TM Distance: >3 FB Neck ROM: full   Comment: [place]  (+) Partial Upper, Partial Lower   Pulmonary COPD,  COPD inhaler, former smoker,    Pulmonary exam normal        Cardiovascular hypertension, + CAD  Normal cardiovascular exam     Neuro/Psych  Headaches, negative psych ROS   GI/Hepatic Neg liver ROS, GERD  Medicated,  Endo/Other  negative endocrine ROS  Renal/GU Renal InsufficiencyRenal disease     Musculoskeletal   Abdominal   Peds  Hematology negative hematology ROS (+)   Anesthesia Other Findings Past Medical History: No date: Atrial fibrillation (HCC) No date: Cancer of kidney ([place]) No date: Emphysema lung (HCC) No date: Hypertension  Past Surgical History: No date: APPENDECTOMY No date: ROTATOR CUFF REPAIR     Comment:  right No date: TONSILLECTOMY  BMI    Body Mass Index: 23.49 kg/m      Reproductive/Obstetrics negative OB ROS                             Anesthesia Physical Anesthesia Plan  ASA: 3  Anesthesia Plan: General LMA   Post-op Pain Management:    Induction: Intravenous  PONV Risk Score and Plan: Dexamethasone, Ondansetron, Midazolam and Treatment may vary due to age or medical condition  Airway Management Planned: LMA  Additional Equipment:   Intra-op Plan:   Post-operative Plan: Extubation in OR  Informed Consent: I have reviewed the patients History and Physical, chart, labs and discussed the procedure including the risks, benefits and alternatives for the proposed anesthesia with the patient or authorized representative who has indicated his/her understanding and acceptance.     Dental Advisory Given  Plan Discussed with:  Anesthesiologist, CRNA and Surgeon  Anesthesia Plan Comments: (Patient consented for risks of anesthesia including but not limited to:  - adverse reactions to medications - damage to eyes, teeth, lips or other oral mucosa - nerve damage due to positioning  - sore throat or hoarseness - Damage to heart, brain, nerves, lungs, other parts of body or loss of life  Patient voiced understanding.)        Anesthesia Quick Evaluation

## 2020-10-03 NOTE — Transfer of Care (Signed)
Immediate Anesthesia Transfer of Care Note  Patient: Duane Klein  Procedure(s) Performed: PERCUTANEOUS SCREW FIXATION OF LEFT SCAPHOID FRACTURE. (Left: Wrist)  Patient Location: PACU  Anesthesia Type:General  Level of Consciousness: awake, drowsy and patient cooperative  Airway & Oxygen Therapy: Patient Spontanous Breathing and Patient connected to face mask oxygen  Post-op Assessment: Report given to RN and Post -op Vital signs reviewed and stable  Post vital signs: Reviewed and stable  Last Vitals:  Vitals Value Taken Time  BP 128/75 10/03/20 1500  Temp    Pulse 99 10/03/20 1503  Resp 15 10/03/20 1503  SpO2 91 % 10/03/20 1503  Vitals shown include unvalidated device data.  Last Pain:  Vitals:   10/03/20 1108  TempSrc: Tympanic  PainSc: 0-No pain      Patients Stated Pain Goal: 0 ([phone] [phone])  Complications: No notable events documented.

## 2020-10-04 ENCOUNTER — Ambulatory Visit: Admission: RE | Admit: 2020-10-04 | Payer: Medicare Other | Source: Home / Self Care | Admitting: Podiatry

## 2020-10-04 ENCOUNTER — Other Ambulatory Visit: Payer: Self-pay

## 2020-10-04 ENCOUNTER — Observation Stay: Payer: Medicare Other | Admitting: Certified Registered Nurse Anesthetist

## 2020-10-04 ENCOUNTER — Encounter: Payer: Self-pay | Admitting: Surgery

## 2020-10-04 ENCOUNTER — Observation Stay: Payer: Medicare Other

## 2020-10-04 ENCOUNTER — Encounter
Admission: RE | Disposition: A | Discharge status: Home / Self Care | Payer: Self-pay | Source: Home / Self Care | Attending: Surgery

## 2020-10-04 DIAGNOSIS — S62002A Unspecified fracture of navicular [scaphoid] bone of left wrist, initial encounter for closed fracture: Secondary | ICD-10-CM | POA: Diagnosis not present

## 2020-10-04 HISTORY — PX: ORIF TOE FRACTURE: SHX5032

## 2020-10-04 LAB — GRAM STAIN: Gram Stain: NONE SEEN

## 2020-10-04 LAB — SURGICAL PCR SCREEN
MRSA, PCR: NEGATIVE
Staphylococcus aureus: NEGATIVE

## 2020-10-04 SURGERY — OPEN REDUCTION INTERNAL FIXATION (ORIF) METATARSAL (TOE) FRACTURE
Anesthesia: General | Site: Toe | Laterality: Right

## 2020-10-04 MED ORDER — ONDANSETRON HCL 4 MG/2ML IJ SOLN
INTRAMUSCULAR | Status: DC | PRN
  Administered 2020-10-04: 4 mg via INTRAVENOUS

## 2020-10-04 MED ORDER — PROMETHAZINE HCL 25 MG/ML IJ SOLN: 6.2500 mg | INTRAMUSCULAR | Status: DC | PRN

## 2020-10-04 MED ORDER — FENTANYL CITRATE (PF) 100 MCG/2ML IJ SOLN
INTRAMUSCULAR | Status: AC
  Filled 2020-10-04: qty 2

## 2020-10-04 MED ORDER — EPHEDRINE SULFATE 50 MG/ML IJ SOLN
INTRAMUSCULAR | Status: DC | PRN
  Administered 2020-10-04: 5 mg via INTRAVENOUS

## 2020-10-04 MED ORDER — ACETAMINOPHEN 10 MG/ML IV SOLN
INTRAVENOUS | Status: AC
  Filled 2020-10-04: qty 100

## 2020-10-04 MED ORDER — BUPIVACAINE HCL (PF) 0.5 % IJ SOLN
INTRAMUSCULAR | Status: DC | PRN
  Administered 2020-10-04: 10 mL

## 2020-10-04 MED ORDER — DEXAMETHASONE SODIUM PHOSPHATE 10 MG/ML IJ SOLN
INTRAMUSCULAR | Status: DC | PRN
  Administered 2020-10-04: 10 mg via INTRAVENOUS

## 2020-10-04 MED ORDER — GLYCOPYRROLATE 0.2 MG/ML IJ SOLN
INTRAMUSCULAR | Status: DC | PRN
  Administered 2020-10-04: .2 mg via INTRAVENOUS

## 2020-10-04 MED ORDER — FENTANYL CITRATE (PF) 100 MCG/2ML IJ SOLN
INTRAMUSCULAR | Status: DC | PRN
  Administered 2020-10-04 (×4): 50 ug via INTRAVENOUS

## 2020-10-04 MED ORDER — BUPIVACAINE HCL (PF) 0.5 % IJ SOLN
INTRAMUSCULAR | Status: AC
  Filled 2020-10-04: qty 30

## 2020-10-04 MED ORDER — LIDOCAINE HCL (CARDIAC) PF 100 MG/5ML IV SOSY
PREFILLED_SYRINGE | INTRAVENOUS | Status: DC | PRN
  Administered 2020-10-04: 80 mg via INTRAVENOUS

## 2020-10-04 MED ORDER — 0.9 % SODIUM CHLORIDE (POUR BTL) OPTIME
TOPICAL | Status: DC | PRN
  Administered 2020-10-04: 500 mL

## 2020-10-04 MED ORDER — SODIUM CHLORIDE 0.9 % IV SOLN
INTRAVENOUS | Status: DC | PRN
  Administered 2020-10-04: 30 ug/min via INTRAVENOUS

## 2020-10-04 MED ORDER — ACETAMINOPHEN 10 MG/ML IV SOLN
INTRAVENOUS | Status: DC | PRN
  Administered 2020-10-04: 1000 mg via INTRAVENOUS

## 2020-10-04 MED ORDER — LIDOCAINE HCL (PF) 1 % IJ SOLN
INTRAMUSCULAR | Status: AC
  Filled 2020-10-04: qty 30

## 2020-10-04 MED ORDER — HYDROMORPHONE HCL 1 MG/ML IJ SOLN
INTRAMUSCULAR | Status: AC
  Filled 2020-10-04: qty 1

## 2020-10-04 MED ORDER — DEXMEDETOMIDINE (PRECEDEX) IN NS 20 MCG/5ML (4 MCG/ML) IV SYRINGE
PREFILLED_SYRINGE | INTRAVENOUS | Status: DC | PRN
  Administered 2020-10-04: 8 ug via INTRAVENOUS
  Administered 2020-10-04: 4 ug via INTRAVENOUS
  Administered 2020-10-04: 8 ug via INTRAVENOUS
  Administered 2020-10-04: 4 ug via INTRAVENOUS
  Administered 2020-10-04: 10 ug via INTRAVENOUS
  Administered 2020-10-04: 8 ug via INTRAVENOUS

## 2020-10-04 MED ORDER — PROPOFOL 10 MG/ML IV BOLUS
INTRAVENOUS | Status: DC | PRN
  Administered 2020-10-04: 150 mg via INTRAVENOUS

## 2020-10-04 MED ORDER — LACTATED RINGERS IV SOLN: INTRAVENOUS | Status: DC | PRN

## 2020-10-04 MED ORDER — CEFAZOLIN SODIUM-DEXTROSE 2-4 GM/100ML-% IV SOLN
INTRAVENOUS | Status: AC
  Filled 2020-10-04: qty 100

## 2020-10-04 MED ORDER — BUPIVACAINE LIPOSOME 1.3 % IJ SUSP
INTRAMUSCULAR | Status: DC | PRN
  Administered 2020-10-04: 20 mL

## 2020-10-04 MED ORDER — HYDROMORPHONE HCL 1 MG/ML IJ SOLN
INTRAMUSCULAR | Status: DC | PRN
  Administered 2020-10-04: 1 mg via INTRAVENOUS

## 2020-10-04 MED ORDER — FENTANYL CITRATE (PF) 100 MCG/2ML IJ SOLN: 25.0000 ug | INTRAMUSCULAR | Status: DC | PRN

## 2020-10-04 MED ORDER — BUPIVACAINE LIPOSOME 1.3 % IJ SUSP
INTRAMUSCULAR | Status: AC
  Filled 2020-10-04: qty 20

## 2020-10-04 MED ORDER — PHENYLEPHRINE HCL (PRESSORS) 10 MG/ML IV SOLN
INTRAVENOUS | Status: DC | PRN
  Administered 2020-10-04 (×6): 100 ug via INTRAVENOUS

## 2020-10-04 MED ORDER — MIDAZOLAM HCL 2 MG/2ML IJ SOLN
INTRAMUSCULAR | Status: DC | PRN
  Administered 2020-10-04: 2 mg via INTRAVENOUS

## 2020-10-04 SURGICAL SUPPLY — 69 items
BIT DRILL 2 FAST STEP (BIT) ×2 IMPLANT
BIT DRILL 2.5X2.75 QC CALB (BIT) ×4 IMPLANT
BLADE SURG 15 STRL LF DISP TIS (BLADE) IMPLANT
BLADE SURG 15 STRL SS (BLADE)
BNDG COHESIVE 4X5 TAN ST LF (GAUZE/BANDAGES/DRESSINGS) ×2 IMPLANT
BNDG CONFORM 2 STRL LF (GAUZE/BANDAGES/DRESSINGS) IMPLANT
BNDG CONFORM 3 STRL LF (GAUZE/BANDAGES/DRESSINGS) ×2 IMPLANT
BNDG ELASTIC 4X5.8 VLCR NS LF (GAUZE/BANDAGES/DRESSINGS) IMPLANT
BNDG ESMARK 4X12 TAN STRL LF (GAUZE/BANDAGES/DRESSINGS) ×2 IMPLANT
BNDG GAUZE ELAST 4 BULKY (GAUZE/BANDAGES/DRESSINGS) ×4 IMPLANT
CANISTER SUCT 1200ML W/VALVE (MISCELLANEOUS) IMPLANT
CUFF TOURN SGL QUICK 12 (TOURNIQUET CUFF) IMPLANT
CUFF TOURN SGL QUICK 18X4 (TOURNIQUET CUFF) IMPLANT
CUFF TOURN SGL QUICK 24 (TOURNIQUET CUFF) ×1
CUFF TRNQT CYL 24X4X16.5-23 (TOURNIQUET CUFF) ×1 IMPLANT
DRAPE FLUOR MINI C-ARM 54X84 (DRAPES) ×2 IMPLANT
DRAPE SURG 17X11 SM STRL (DRAPES) ×2 IMPLANT
DURAPREP 26ML APPLICATOR (WOUND CARE) ×2 IMPLANT
ELECT REM PT RETURN 9FT ADLT (ELECTROSURGICAL) ×2
ELECTRODE REM PT RTRN 9FT ADLT (ELECTROSURGICAL) ×1 IMPLANT
GAUZE 4X4 16PLY ~~LOC~~+RFID DBL (SPONGE) ×4 IMPLANT
GAUZE SPONGE 4X4 12PLY STRL (GAUZE/BANDAGES/DRESSINGS) IMPLANT
GAUZE XEROFORM 1X8 LF (GAUZE/BANDAGES/DRESSINGS) ×4 IMPLANT
GLOVE SURG ENC MOIS LTX SZ7.5 (GLOVE) ×2 IMPLANT
GLOVE SURG UNDER LTX SZ8 (GLOVE) ×2 IMPLANT
GOWN STRL REUS W/ TWL XL LVL3 (GOWN DISPOSABLE) ×2 IMPLANT
GOWN STRL REUS W/TWL XL LVL3 (GOWN DISPOSABLE) ×2
K-WIRE ACE 1.6X6 (WIRE) ×4
KWIRE ACE 1.6X6 (WIRE) ×2 IMPLANT
MANIFOLD NEPTUNE II (INSTRUMENTS) ×4 IMPLANT
NEEDLE FILTER BLUNT 18X 1/2SAF (NEEDLE) ×1
NEEDLE FILTER BLUNT 18X1 1/2 (NEEDLE) ×1 IMPLANT
NEEDLE HYPO 22GX1.5 SAFETY (NEEDLE) ×2 IMPLANT
NEEDLE HYPO 25X1 1.5 SAFETY (NEEDLE) ×4 IMPLANT
NS IRRIG 500ML POUR BTL (IV SOLUTION) ×2 IMPLANT
PACK EXTREMITY ARMC (MISCELLANEOUS) ×2 IMPLANT
PAD ABD DERMACEA PRESS 5X9 (GAUZE/BANDAGES/DRESSINGS) ×2 IMPLANT
PADDING CAST BLEND 4X4 NS (MISCELLANEOUS) ×2 IMPLANT
PENCIL ELECTRO HAND CTR (MISCELLANEOUS) ×2 IMPLANT
PENCIL SMOKE EVACUATOR (MISCELLANEOUS) ×2 IMPLANT
PLATE LOCK COMP (Plate) ×2 IMPLANT
PLATE LOCK SM RT NAVICULAR FT (Plate) ×2 IMPLANT
SCREW CORT 3.5X32 (Screw) ×1 IMPLANT
SCREW CORT T15 32X3.5XST LCK (Screw) ×1 IMPLANT
SCREW LOCK 3.5X24 DIST TIB (Screw) ×2 IMPLANT
SCREW LOCK CORT STAR 3.5X24 (Screw) ×2 IMPLANT
SCREW LOCK CORT STAR 3.5X28 (Screw) ×2 IMPLANT
SCREW LOCK CORT STAR 3.5X30 (Screw) ×2 IMPLANT
SCREW MULTI DIRECT 14MM (Screw) ×2 IMPLANT
SCREW MULTI DIRECT 16MM (Screw) ×2 IMPLANT
SCREW MULTI DIRECT 18MM (Screw) ×2 IMPLANT
SCREW PEG 2.5X18 NONLOCK (Screw) ×2 IMPLANT
SCREW PEG 2.5X20 NONLOCK (Screw) ×4 IMPLANT
SCREW PEG LOCK 2.5X14 (Peg) ×5 IMPLANT
SCREW PEG LOCK 2.5X16 (Peg) ×5 IMPLANT
SPLINT CAST 1 STEP 4X30 (MISCELLANEOUS) ×2 IMPLANT
SPLINT FAST PLASTER 5X30 (CAST SUPPLIES)
SPLINT PLASTER CAST FAST 5X30 (CAST SUPPLIES) IMPLANT
STOCKINETTE M/LG 89821 (MISCELLANEOUS) IMPLANT
STRAP SAFETY 5IN WIDE (MISCELLANEOUS) ×2 IMPLANT
STRIP CLOSURE SKIN 1/4X4 (GAUZE/BANDAGES/DRESSINGS) IMPLANT
SUT ETH BLK MONO 3 0 FS 1 12/B (SUTURE) ×6 IMPLANT
SUT VIC AB 3-0 SH 27 (SUTURE) ×2
SUT VIC AB 3-0 SH 27X BRD (SUTURE) ×2 IMPLANT
SUT VIC AB 4-0 FS2 27 (SUTURE) ×2 IMPLANT
SWABSTK COMLB BENZOIN TINCTURE (MISCELLANEOUS) IMPLANT
SYR 10ML LL (SYRINGE) ×2 IMPLANT
SYR 50ML LL SCALE MARK (SYRINGE) ×2 IMPLANT
WIRE Z .062 C-WIRE SPADE TIP (WIRE) ×2 IMPLANT

## 2020-10-04 NOTE — Discharge Instructions (Addendum)
INSTRUCTIONS AFTER Surgery  Remove items at home which could result in a fall. This includes throw rugs or furniture in walking pathways ICE to the affected joint every three hours while awake for 30 minutes at a time, for at least the first 3-5 days, and then as needed for pain and swelling.  Continue to use ice for pain and swelling. You may notice swelling that will progress down to the foot and ankle.  This is normal after surgery.  Elevate your leg when you are not up walking on it.   Continue to use the breathing machine you got in the hospital (incentive spirometer) which will help keep your temperature down.  It is common for your temperature to cycle up and down following surgery, especially at night when you are not up moving around and exerting yourself.  The breathing machine keeps your lungs expanded and your temperature down.   DIET:  As you were doing prior to hospitalization, we recommend a well-balanced diet.  DRESSING / WOUND CARE / SHOWERING  Keep splint and dressing on at all times.  No showering.  ACTIVITY  Increase activity slowly as tolerated, but follow the weight bearing instructions below.   No driving for 6 weeks or until further direction given by your physician.  You cannot drive while taking narcotics.  No lifting or carrying greater than 10 lbs. until further directed by your surgeon. Avoid periods of inactivity such as sitting longer than an hour when not asleep. This helps prevent blood clots.  You may return to work once you are authorized by your doctor.     WEIGHT BEARING  None weight-bear on your surgical leg   EXERCISES No exercises  CONSTIPATION  Constipation is defined medically as fewer than three stools per week and severe constipation as less than one stool per week.  Even if you have a regular bowel pattern at home, your normal regimen is likely to be disrupted due to multiple reasons following surgery.  Combination of anesthesia,  postoperative narcotics, change in appetite and fluid intake all can affect your bowels.   YOU MUST use at least one of the following options; they are listed in order of increasing strength to get the job done.  They are all available over the counter, and you may need to use some, POSSIBLY even all of these options:    Drink plenty of fluids (prune juice may be helpful) and high fiber foods Colace 100 mg by mouth twice a day  Senokot for constipation as directed and as needed Dulcolax (bisacodyl), take with full glass of water  Miralax (polyethylene glycol) once or twice a day as needed.  If you have tried all these things and are unable to have a bowel movement in the first 3-4 days after surgery call either your surgeon or your primary doctor.    If you experience loose stools or diarrhea, hold the medications until you stool forms back up.  If your symptoms do not get better within 1 week or if they get worse, check with your doctor.  If you experience "the worst abdominal pain ever" or develop nausea or vomiting, please contact the office immediately for further recommendations for treatment.   ITCHING:  If you experience itching with your medications, try taking only a single pain pill, or even half a pain pill at a time.  You can also use Benadryl over the counter for itching or also to help with sleep.   TED HOSE STOCKINGS:  Use stockings on both legs until for at least 2 weeks or as directed by physician office. They may be removed at night for sleeping.  MEDICATIONS:  See your medication summary on the "After Visit Summary" that nursing will review with you.  You may have some home medications which will be placed on hold until you complete the course of blood thinner medication.  It is important for you to complete the blood thinner medication as prescribed.  PRECAUTIONS:  If you experience chest pain or shortness of breath - call 911 immediately for transfer to the hospital emergency  department.   If you develop a fever greater that 101 F, purulent drainage from wound, increased redness or drainage from wound, foul odor from the wound/dressing, or calf pain - CONTACT YOUR SURGEON.                                                   FOLLOW-UP APPOINTMENTS:  If you do not already have a post-op appointment, please call the office for an appointment to be seen by your surgeon.  Guidelines for how soon to be seen are listed in your "After Visit Summary", but are typically between 1-4 weeks after surgery.  OTHER INSTRUCTIONS:     MAKE SURE YOU:  Understand these instructions.  Get help right away if you are not doing well or get worse.    Thank you for letting us be a part of your medical care team.  It is a privilege we respect greatly.  We hope these instructions will help you stay on track for a fast and full recovery!   [place]  POST OPERATIVE INSTRUCTIONS FOR DR. Vickki Klein AND DR. Arthur   Take your medication as prescribed.  Pain medication should be taken only as needed.  You may also take ibuprofen and/or Tylenol between pain medication doses if pain is uncontrolled.  Keep the dressing clean, dry and intact.  Start Lovenox medication after discharge.  Keep your foot elevated above the heart level for the first 48 hours.  Continue elevation and icing thereafter to improve swelling.  Walking to the bathroom and brief periods of walking are acceptable, unless we have instructed you to be non-weight bearing.  Remain nonweightbearing at all times to the right lower extremity.  Do not take a shower. Baths are permissible as long as the foot is kept out of the water.   Every hour you are awake:  Bend your knee 15 times. Massage calf 15 times  Call [facility] [phone]) if any of the following problems occur: You develop a temperature or fever. The bandage becomes saturated  with blood. Medication does not stop your pain. Injury of the foot occurs. Any symptoms of infection including redness, odor, or red streaks running from wound.

## 2020-10-04 NOTE — Progress Notes (Signed)
OT Cancellation Note  Patient Details Name: Duane Klein MRN: [id number] DOB: 12/17/1950   Cancelled Treatment:    Reason Eval/Treat Not Completed: Patient at procedure or test/ unavailable Pt OTF for surgery at this time. Will f/u for OT evaluation at later date/time as able/pt available. Thank you.  Gerrianne Scale, [place], OTR/L ascom [phone] 10/04/20, 4:38 PM

## 2020-10-04 NOTE — Progress Notes (Signed)
PT Cancellation Note  Patient Details Name: Duane Klein MRN: [id number] DOB: [birth year]   Cancelled Treatment:    Reason Eval/Treat Not Completed: Other (comment) PT orders received, chart reviewed. Pt is scheduled to undergo surgery again this afternoon. Per protocol, will need new orders after pt undergoes general anesthesia. Will complete current orders & await for new orders following surgery.   Lavone Nian, PT, DPT 10/04/20, 9:00 AM    [name] 10/04/2020, 8:59 AM

## 2020-10-04 NOTE — Anesthesia Procedure Notes (Signed)
Procedure Name: LMA Insertion Date/Time: 10/04/2020 12:41 PM Performed by: Willette Alma, CRNA Pre-anesthesia Checklist: Patient identified, Patient being monitored, Timeout performed, Emergency Drugs available and Suction available Patient Re-evaluated:Patient Re-evaluated prior to induction Oxygen Delivery Method: Circle system utilized Preoxygenation: Pre-oxygenation with 100% oxygen Induction Type: IV induction Ventilation: Mask ventilation without difficulty LMA: LMA inserted LMA Size: 5.0 Tube type: Oral Number of attempts: 1 Placement Confirmation: positive ETCO2 and breath sounds checked- equal and bilateral Tube secured with: Tape Dental Injury: Teeth and Oropharynx as per pre-operative assessment

## 2020-10-04 NOTE — Op Note (Signed)
Operative note   Surgeon:Naome Brigandi Lawyer: None    Preop diagnosis: 1.  Displaced navicular fracture right foot 2.  Displaced calcaneal fracture right foot 3.  Nondisplaced fifth metatarsal fracture right foot    Postop diagnosis: Same    Procedure: 1.  Open reduction with internal fixation right navicular fracture 2.  Open reduction with internal fixation right calcaneal fracture 3.  Closed treatment right fifth metatarsal fracture without ORIF.    EBL: Minimal    Anesthesia:local and general.  Local consisted of a total of 20 cc of 0.5% bupivacaine and 20 cc of Exparel long-acting anesthetic    Hemostasis: Thigh tourniquet inflated to 250 mmHg for 120 minutes    Specimen: None    Complications: None    Operative indications:Duane Klein is an 70 y.o. that presents today for surgical intervention.  The risks/benefits/alternatives/complications have been discussed and consent has been given.    Procedure:  Patient was brought into the OR and placed on the operating table in thesupine position. After anesthesia was obtained theright lower extremity was prepped and draped in usual sterile fashion.  Attention was directed to the dorsal aspect of the right foot initially.  A longitudinal incision was made from the neck of the talus to the level of the first metatarsal region sharp and blunt dissection carried down between the anterior tibial tendon and extensor hallucis longus tendon.  Care was taken to retract all vital neurovascular structures.  Once down to the level of the navicular the navicular fracture was noted to be severely displaced.  A large fracture fragment was dorsal and medial tenting and causing stress to the skin.  The other major fracture fragment was plantar lateral.  Distal distraction was placed and a joint distractor was placed at the neck of the talus and cuneiform.  Small fracture fragments within the joint were then removed from the surgical field.  The  wound was flushed with copious amounts of irrigation.  Next with a large bone reduction clamp I was then able to anatomically reduce the large fracture fragments down to an anatomic position.  At this time a navicular plate was placed to the dorsal aspect of the foot from the Biomet total foot set.  Multiple combinations of 2.0 mm locking and nonlocking screws were placed diffusely throughout the dorsal aspect of the fracture.  Good anatomic reduction was noted throughout the procedure and good stability was noted.  Attention was then directed laterally to the calcaneocuboid joint where an incision was made at the calcaneocuboid joint proximal to the posterior aspect of the fibula.  Sharp and blunt dissection carried down to the periosteum.  Care was taken to retract the neurovascular structures and the peroneal tendons were retracted throughout the entire procedure.  At this time a large fracture plate fragment of the distal calcaneus at the calcaneocuboid joint was notably laterally and she is displaced as well as shortened.  The fracture fragment was reduced and placed back into a better anatomic position and stabilized.  Next a small locking plate on the lateral aspect of the calcaneus was placed.  This was then X shaped plate from the total foot set.  Initially a nonlocking screw was placed on the distal fracture fragment to anatomically reduce the plate to the bone.  2 locking screws were placed in the proximal central and proximal dorsal screw hole.  The proximal plantar screw hole was left open.  A final locking screw was placed in the  distal fracture fragment.  Anatomic alignment was noted at the subtalar joint as well as the calcaneocuboid joint.  The wound was flushed with copious amounts of irrigation.  Good alignment of the fifth metatarsal fracture was noted both preoperatively and intraoperatively.  At this time no further ORIF was performed to this area.  No manipulation was needed.  All  incisions were closed with a combination of 3-0 Vicryl and 3-0 nylon.  A bulky sterile dressing was applied.  Patient was then placed in a posterior splint under compression.  He will be readmitted to the floor for continued observation postoperative pain management and physical therapy with likely discharge tomorrow.     Patient tolerated the procedure and anesthesia well.  Was transported from the OR to the PACU with all vital signs stable and vascular status intact. Marland Kitchen

## 2020-10-04 NOTE — Transfer of Care (Signed)
Immediate Anesthesia Transfer of Care Note  Patient: Duane Klein  Procedure(s) Performed: NAVICULAR FRACTURE- RT (Right: Toe)  Patient Location: PACU  Anesthesia Type:General  Level of Consciousness: awake, drowsy and patient cooperative  Airway & Oxygen Therapy: Patient Spontanous Breathing and Patient connected to face mask oxygen  Post-op Assessment: Report given to RN and Post -op Vital signs reviewed and stable  Post vital signs: Reviewed and stable  Last Vitals:  Vitals Value Taken Time  BP 110/67 10/04/20 1751  Temp    Pulse 70 10/04/20 1754  Resp 8 10/04/20 1754  SpO2 99 % 10/04/20 1754  Vitals shown include unvalidated device data.  Last Pain:  Vitals:   10/04/20 0815  TempSrc:   PainSc: 3       Patients Stated Pain Goal: 0 ([id number])  Complications: No notable events documented.

## 2020-10-04 NOTE — Progress Notes (Signed)
  Subjective: 1 Day Post-Op Procedure(s) (LRB): PERCUTANEOUS SCREW FIXATION OF LEFT SCAPHOID FRACTURE. (Left) Patient reports pain as mild.  Did not sleep well last night. Patient is well, and has had no acute complaints or problems Plan is to go Home after hospital stay. Negative for chest pain and shortness of breath Fever: no Gastrointestinal: Negative for nausea and vomiting  Objective: Vital signs in last 24 hours: Temp:  [97.4 F (36.3 C)-98.5 F (36.9 C)] 98 F (36.7 C) (07/29 0441) Pulse Rate:  [72-87] 79 (07/29 0441) Resp:  [13-20] 17 (07/29 0441) BP: (109-140)/(72-102) 109/76 (07/29 0441) SpO2:  [94 %-96 %] 96 % (07/29 0441) Weight:  [83 kg] 83 kg (07/28 1108)  Intake/Output from previous day:  Intake/Output Summary (Last 24 hours) at 10/04/2020 0636 Last data filed at 10/04/2020 0300 Gross per 24 hour  Intake 1425 ml  Output --  Net 1425 ml    Intake/Output this shift: Total I/O In: 200 [IV Piggyback:200] Out: -   Labs: No results for input(s): HGB in the last 72 hours. No results for input(s): WBC, RBC, HCT, PLT in the last 72 hours. No results for input(s): NA, K, CL, CO2, BUN, CREATININE, GLUCOSE, CALCIUM in the last 72 hours. No results for input(s): LABPT, INR in the last 72 hours.   EXAM General - Patient is Alert and Oriented Extremity - Neurovascular intact Sensation intact distally Moving fingers comfortably.  Splint is intact. Dressing/Incision - clean, dry, no drainage, with a thumb spica splint intact Motor Function - intact, moving fingers well on exam.   Past Medical History:  Diagnosis Date   Atrial fibrillation ([place])    Cancer of kidney ([place])    Emphysema lung ([place])    Hypertension     Assessment/Plan: 1 Day Post-Op Procedure(s) (LRB): PERCUTANEOUS SCREW FIXATION OF LEFT SCAPHOID FRACTURE. (Left) Active Problems:   Scaphoid fracture  Estimated body mass index is 23.49 kg/m as calculated from the following:   Height as of this  encounter: '6\' 2"'$  (1.88 m).   Weight as of this encounter: 83 kg.  N.p.o. today before foot surgery.  After surgery, plan on advancing diet.  Plan for foot surgery today with Dr. Vickki Muff. Discharge after foot surgery.  Plan to go home. Follow-up at [facility] orthopedics in 2 weeks for wound care and possible cast application.  DVT Prophylaxis - None   Reche Dixon, PA-C Orthopaedic Surgery 10/04/2020, 6:36 AM

## 2020-10-04 NOTE — Anesthesia Postprocedure Evaluation (Signed)
Anesthesia Post Note  Patient: Duane Klein  Procedure(s) Performed: NAVICULAR FRACTURE- RT (Right: Toe)  Patient location during evaluation: PACU Anesthesia Type: General Level of consciousness: awake and alert Pain management: pain level controlled Vital Signs Assessment: post-procedure vital signs reviewed and stable Respiratory status: spontaneous breathing Cardiovascular status: blood pressure returned to baseline Postop Assessment: no headache Anesthetic complications: no   No notable events documented.   Last Vitals:  Vitals:   10/04/20 1827 10/04/20 1856  BP: 120/82 116/71  Pulse: 66 74  Resp: 17 18  Temp: (!) 36.4 C 36.9 C  SpO2: 94% 97%    Last Pain:  Vitals:   10/04/20 1849  TempSrc:   PainSc: 0-No pain                 Milinda Pointer

## 2020-10-04 NOTE — Anesthesia Postprocedure Evaluation (Signed)
Anesthesia Post Note  Patient: Duane Klein  Procedure(s) Performed: PERCUTANEOUS SCREW FIXATION OF LEFT SCAPHOID FRACTURE. (Left: Wrist)  Patient location during evaluation: PACU Anesthesia Type: General Level of consciousness: awake and alert Pain management: pain level controlled Vital Signs Assessment: post-procedure vital signs reviewed and stable Respiratory status: spontaneous breathing, nonlabored ventilation, respiratory function stable and patient connected to nasal cannula oxygen Cardiovascular status: blood pressure returned to baseline and stable Postop Assessment: no apparent nausea or vomiting Anesthetic complications: no   No notable events documented.   Last Vitals:  Vitals:   10/04/20 0048 10/04/20 0441  BP: 122/72 109/76  Pulse: 82 79  Resp: 17 17  Temp: 36.7 C 36.7 C  SpO2: 96% 96%    Last Pain:  Vitals:   10/03/20 2336  TempSrc:   PainSc: 6                  Margaree Mackintosh

## 2020-10-04 NOTE — H&P (Signed)
HISTORY AND PHYSICAL INTERVAL NOTE:  10/04/2020  1:51 PM  Duane Klein  has presented today for surgery, with the diagnosis of S92.251A- Navicular fracture of right foot.  The various methods of treatment have been discussed with the patient.  No guarantees were given.  After consideration of risks, benefits and other options for treatment, the patient has consented to surgery.  I have reviewed the patients' chart and labs.     A history and physical examination was performed in my office.  The patient was reexamined.  There have been no changes to this history and physical examination.  Duane Klein A

## 2020-10-04 NOTE — Plan of Care (Signed)

## 2020-10-04 NOTE — Anesthesia Preprocedure Evaluation (Signed)
Anesthesia Evaluation  Patient identified by MRN, date of birth, ID band Patient awake    Reviewed: Allergy & Precautions, NPO status , Patient's Chart, lab work & pertinent test results  Airway Mallampati: II  TM Distance: >3 FB Neck ROM: full   Comment: Duane Klein  (+) Partial Upper, Partial Lower, Dental Advidsory Given   Pulmonary COPD,  COPD inhaler, former smoker,    Pulmonary exam normal        Cardiovascular hypertension, + CAD  Normal cardiovascular exam     Neuro/Psych  Headaches, negative psych ROS   GI/Hepatic Neg liver ROS, GERD  Medicated,  Endo/Other  negative endocrine ROS  Renal/GU Renal InsufficiencyRenal disease     Musculoskeletal   Abdominal   Peds  Hematology negative hematology ROS (+)   Anesthesia Other Findings Past Medical History: No date: Atrial fibrillation ([facility]) No date: Cancer of kidney ([place]) No date: Emphysema lung ([facility]) No date: Hypertension  Past Surgical History: No date: APPENDECTOMY No date: ROTATOR CUFF REPAIR     Comment:  right No date: TONSILLECTOMY  BMI    Body Mass Index: 23.49 kg/m      Reproductive/Obstetrics negative OB ROS                             Anesthesia Physical  Anesthesia Plan  ASA: 3  Anesthesia Plan: General LMA   Post-op Pain Management:    Induction: Intravenous  PONV Risk Score and Plan: Dexamethasone, Ondansetron, Midazolam and Treatment may vary due to age or medical condition  Airway Management Planned: LMA  Additional Equipment:   Intra-op Plan:   Post-operative Plan: Extubation in OR  Informed Consent: I have reviewed the patients History and Physical, chart, labs and discussed the procedure including the risks, benefits and alternatives for the proposed anesthesia with the patient or authorized representative who has indicated his/her understanding and acceptance.     Dental Advisory  Given  Plan Discussed with: Anesthesiologist, CRNA and Surgeon  Anesthesia Plan Comments: (Patient consented for risks of anesthesia including but not limited to:  - adverse reactions to medications - damage to eyes, teeth, lips or other oral mucosa - nerve damage due to positioning  - sore throat or hoarseness - Damage to heart, brain, nerves, lungs, other parts of body or loss of life  Patient voiced understanding.)        Anesthesia Quick Evaluation

## 2020-10-05 DIAGNOSIS — S62002A Unspecified fracture of navicular [scaphoid] bone of left wrist, initial encounter for closed fracture: Secondary | ICD-10-CM | POA: Diagnosis not present

## 2020-10-05 MED ORDER — ENOXAPARIN SODIUM 40 MG/0.4ML IJ SOSY: 40.0000 mg | PREFILLED_SYRINGE | INTRAMUSCULAR | 0 refills | Status: DC

## 2020-10-05 MED ORDER — CEPHALEXIN 500 MG PO CAPS: 500.0000 mg | ORAL_CAPSULE | Freq: Three times a day (TID) | ORAL | 0 refills | Status: AC

## 2020-10-05 MED ORDER — ENOXAPARIN SODIUM 40 MG/0.4ML IJ SOSY
40.0000 mg | PREFILLED_SYRINGE | INTRAMUSCULAR | Status: DC
  Administered 2020-10-05: 40 mg via SUBCUTANEOUS
  Filled 2020-10-05: qty 0.4

## 2020-10-05 MED ORDER — OXYCODONE-ACETAMINOPHEN 7.5-325 MG PO TABS
1.0000 | ORAL_TABLET | Freq: Four times a day (QID) | ORAL | 0 refills | Status: AC | PRN
Start: 2020-10-05 — End: 2020-10-12

## 2020-10-05 NOTE — Progress Notes (Signed)
PODIATRY / FOOT AND ANKLE SURGERY PROGRESS NOTE  Chief Complaint: Right foot pain, left wrist pain   HPI: Duane Klein is a 70 y.o. male who presents with resting in bed comfortably status post 2 days left wrist ORIF and 1 day status post right calcaneus fracture ORIF with navicular fracture ORIF with Dr. Vickki Muff.  Patient splint is clean, dry, and intact today to the right foot.  Patient has not been putting any weight on the right foot and states that his pain is well controlled at this time.  Patient does have a history of pulmonary embolism.  PMHx:  Past Medical History:  Diagnosis Date   Atrial fibrillation ([place])    Cancer of kidney ([place])    Emphysema lung ([place])    Hypertension     Surgical Hx:  Past Surgical History:  Procedure Laterality Date   APPENDECTOMY     ORIF SCAPHOID FRACTURE Left 10/03/2020   Procedure: PERCUTANEOUS SCREW FIXATION OF LEFT SCAPHOID FRACTURE.;  Surgeon: Corky Mull, MD;  Location: [facility] ORS;  Service: Orthopedics;  Laterality: Left;   ROTATOR CUFF REPAIR     right   TONSILLECTOMY      FHx: History reviewed. No pertinent family history.  Social History:  reports that he quit smoking about 18 years ago. His smoking use included cigarettes. He has a 105.00 pack-year smoking history. He quit smokeless tobacco use about 23 years ago.  His smokeless tobacco use included snuff and chew. He reports current alcohol use. He reports that he does not use drugs.  Allergies:  Allergies  Allergen Reactions   Gabapentin     Makes patient violent   Tramadol Itching    Medications Prior to Admission  Medication Sig Dispense Refill   acetaminophen (TYLENOL) 500 MG tablet Take 500 mg by mouth in the morning and at bedtime.     albuterol (PROVENTIL) (2.5 MG/3ML) 0.083% nebulizer solution Take 3 mLs (2.5 mg total) by nebulization every 6 (six) hours as needed for wheezing or shortness of breath. 75 mL 1   albuterol (VENTOLIN HFA) 108 (90 Base) MCG/ACT inhaler  Inhale 2 puffs into the lungs every 6 (six) hours as needed for wheezing or shortness of breath.     amitriptyline (ELAVIL) 10 MG tablet Take 20 mg by mouth at bedtime.     aspirin 81 MG EC tablet Take 81 mg by mouth daily.     benzonatate (TESSALON) 100 MG capsule Take 100 mg by mouth 3 (three) times daily as needed for cough.     Chlorpheniramine Maleate (CHLOR-TABLETS PO) Take 3 tablets by mouth every 6 (six) hours as needed (allergies).     docusate sodium (COLACE) 100 MG capsule Take 100-200 mg by mouth daily.     fluticasone (FLONASE) 50 MCG/ACT nasal spray Place 2 sprays into both nostrils in the morning and at bedtime.     lisinopril (ZESTRIL) 20 MG tablet Take 10 mg by mouth daily.     methocarbamol (ROBAXIN) 750 MG tablet Take 750 mg by mouth every 6 (six) hours as needed for muscle spasms.     mometasone (ASMANEX) 220 MCG/INH inhaler Inhale 2 puffs into the lungs daily.     Multiple Vitamin (MULTI-VITAMIN) tablet Take 1 tablet by mouth daily.     omeprazole (PRILOSEC) 40 MG capsule Take 40 mg by mouth 2 (two) times daily before a meal.     oxyCODONE-acetaminophen (PERCOCET) 5-325 MG tablet Take 1 tablet by mouth every 4 (four) hours as  needed for severe pain. 20 tablet 0   predniSONE (DELTASONE) 10 MG tablet Take 1 tablet (10 mg total) by mouth daily. Day 1-3: take 4 tablets PO daily Day 4-6: take 3 tablets PO daily Day 7-9: take 2 tablets PO daily Day 10-12: take 1 tablet PO daily 30 tablet 0   simvastatin (ZOCOR) 20 MG tablet Take 10 mg by mouth at bedtime.     terazosin (HYTRIN) 5 MG capsule Take 5 mg by mouth at bedtime.     Tiotropium Bromide-Olodaterol 2.5-2.5 MCG/ACT AERS Inhale 2 puffs into the lungs daily.     topiramate (TOPAMAX) 100 MG tablet Take 100 mg by mouth at bedtime.     ondansetron (ZOFRAN ODT) 4 MG disintegrating tablet Take 1 tablet (4 mg total) by mouth every 8 (eight) hours as needed for nausea or vomiting. (Patient not taking: No sig reported) 10 tablet 0     Physical Exam: General: Alert and oriented.  No apparent distress. Patient able to perform dorsiflexion and plantarflexion of right foot digits.  Patient also has light touch sensation intact to digits to the right foot.  Capillary fill time appears to be intact to digits right foot.  No strikethrough noted to splint which appears to be clean, dry, and intact.  Results for orders placed or performed during the hospital encounter of 10/03/20 (from the past 48 hour(s))  Surgical pcr screen     Status: None   Collection Time: 10/04/20  6:08 AM   Specimen: Nasal Mucosa; Nasal Swab  Result Value Ref Range   MRSA, PCR NEGATIVE NEGATIVE   Staphylococcus aureus NEGATIVE NEGATIVE    Comment: (NOTE) The Xpert SA Assay (FDA approved for NASAL specimens in patients 52 years of age and older), is one component of a comprehensive surveillance program. It is not intended to diagnose infection nor to guide or monitor treatment. Performed at [facility], [place], [place] [address]   Gram stain     Status: None (Preliminary result)   Collection Time: 10/04/20  2:55 PM   Specimen: Wound  Result Value Ref Range   Specimen Description WOUND    Special Requests NONE    Gram Stain      NO ORGANISMS SEEN RARE WBC SEEN CALLED TO ERIKA RAGUCCI '@[id number]'$  ON 07.29.22.Elias-Fela Solis Performed at [facility], [address] [place], [place] [address]    Report Status PENDING    DG MINI C-ARM IMAGE ONLY  Result Date: 10/04/2020 There is no interpretation for this exam.  This order is for images obtained during a surgical procedure.  Please See "Surgeries" Tab for more information regarding the procedure.   DG MINI C-ARM IMAGE ONLY  Result Date: 10/03/2020 There is no interpretation for this exam.  This order is for images obtained during a surgical procedure.  Please See "Surgeries" Tab for more information regarding the procedure.    Blood pressure (!) 143/87, pulse 72,  temperature 98.1 F (36.7 C), resp. rate 16, height '6\' 2"'$  (1.88 m), weight 94.3 kg, SpO2 95 %.  Assessment Status post right navicular fracture ORIF, right calcaneal fracture ORIF Status post left wrist fracture ORIF  Plan -Patient seen and examined. -Splint appears to be clean, dry, and intact to the right lower extremity and to the left wrist.  No strikethrough noted. -Patient's pain well controlled at this time.  Patient will be discharged on Percocet 7.5/325 1 p.o. every 6 hours as needed pain, 28 tabs 1 week course.  Patient may also take ibuprofen or Tylenol between pain medication doses if pain is still uncontrolled. -Patient to remain nonweightbearing to the right lower extremity at all times. -Continue to follow-up with orthopedic instructions for left wrist. -Appreciate PT/OT recommendations.  Once patient has been seen and evaluated by them today patient can be discharged if deemed safe from PT and OT standpoint. -We will also discharge patient on Keflex 500 mg 1 p.o. 3 times daily for 7-day course.  Patient will also go home on Lovenox 40 mg subcutaneous daily for the next 4 weeks.  Patient instructed on usage. -Patient to follow-up in clinic within 1 week of discharge date.  Caroline More, DPM 10/05/2020, 11:10 AM

## 2020-10-05 NOTE — Plan of Care (Signed)
  Problem: Education: Goal: Knowledge of General Education information will improve Description: Including pain rating scale, medication(s)/side effects and non-pharmacologic comfort measures 10/05/2020 1333 by Quintella Reichert, LPN Outcome: Adequate for Discharge 10/05/2020 1333 by Quintella Reichert, LPN Outcome: Adequate for Discharge   Problem: Health Behavior/Discharge Planning: Goal: Ability to manage health-related needs will improve 10/05/2020 1333 by Quintella Reichert, LPN Outcome: Adequate for Discharge 10/05/2020 1333 by Quintella Reichert, LPN Outcome: Adequate for Discharge   Problem: Safety: Goal: Ability to remain free from injury will improve 10/05/2020 1333 by Quintella Reichert, LPN Outcome: Adequate for Discharge 10/05/2020 1333 by Quintella Reichert, LPN Outcome: Adequate for Discharge   Problem: Skin Integrity: Goal: Risk for impaired skin integrity will decrease 10/05/2020 1333 by Quintella Reichert, LPN Outcome: Adequate for Discharge 10/05/2020 1333 by Quintella Reichert, LPN Outcome: Adequate for Discharge
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
## 2020-10-05 NOTE — Progress Notes (Signed)
  Subjective: 1 Day Post-Op Procedure(s) (LRB): NAVICULAR FRACTURE- RT (Right) POD ##2 ORIF left scaphoid fracture Patient reports pain as  minimal in the left wrist .  Mor ediscomfort reported in the right foot. Patient is well, and has had no acute complaints or problems Plan is to go Home after hospital stay. Negative for chest pain and shortness of breath Fever: no Gastrointestinal: negative for nausea and vomiting.     Objective: Vital signs in last 24 hours: Temp:  [97.4 F (36.3 C)-98.5 F (36.9 C)] 98.1 F (36.7 C) (07/30 0751) Pulse Rate:  [65-86] 72 (07/30 0751) Resp:  [8-23] 16 (07/30 0751) BP: (109-143)/(66-87) 143/87 (07/30 0751) SpO2:  [94 %-98 %] 95 % (07/30 0751) Weight:  [94.3 kg] 94.3 kg (07/29 1200)  Intake/Output from previous day:  Intake/Output Summary (Last 24 hours) at 10/05/2020 1102 Last data filed at 10/05/2020 1024 Gross per 24 hour  Intake 1896.64 ml  Output 2370 ml  Net -473.36 ml    Intake/Output this shift: Total I/O In: 240 [P.O.:240] Out: 500 [Urine:500]  Labs: No results for input(s): HGB in the last 72 hours. No results for input(s): WBC, RBC, HCT, PLT in the last 72 hours. No results for input(s): NA, K, CL, CO2, BUN, CREATININE, GLUCOSE, CALCIUM in the last 72 hours. No results for input(s): LABPT, INR in the last 72 hours.   EXAM General - Patient is Alert, Appropriate, and Oriented Extremity -  thumb spica dressing intact to LUE, no drainage noted ; short leg splint in place to RLE Dressing/Incision -clean, dry, no drainage Motor Function - able to flex and extend fingers of left hand, neurovascularly intact     Assessment/Plan: 1 Day Post-Op Procedure(s) (LRB): NAVICULAR FRACTURE- RT (Right) Active Problems:   Scaphoid fracture  Estimated body mass index is 26.71 kg/m as calculated from the following:   Height as of this encounter: '6\' 2"'$  (1.88 m).   Weight as of this encounter: 94.3 kg. Advance diet Up with  therapy  Plan for d/c home later today pending podiatry approval. Patient will require platform walker as he cannot place weight through the left hand or R foot      DVT Prophylaxis - Lovenox and Ted hose NonWeight-Bearing  to right leg and left hand  Cassell Smiles, PA-C [facility] 10/05/2020, 11:02 AM
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
## 2020-10-05 NOTE — TOC Initial Note (Addendum)
Transition of Care [facility]) - Initial/Assessment Note    Patient Details  Name: Duane Klein MRN: [id number] Date of Birth: 12-Jun-1950  Transition of Care [facility]) CM/SW Contact:    Magnus Ivan, LCSW Phone Number: 10/05/2020, 12:32 PM  Clinical Narrative:                Spoke to patient about discharge planning. Patient will discharge home with his wife today. Patient is interested in [facility], [place] with [facility] accepted [facility] referral and is aware of DC today. Possible delay in start of care due to staffing. Patient has a knee scooter at home and would like a platform walker. [employer] is out of platform walkers and not sure when they will be in stock. [employer] will have them in stock in a few days, copay is around $10 per [place] - platform walker ordered through [employer] and will be delivered to patient's home when in stock. MD, DPM, PT, and OT aware of delay in delivery. Patient's PCP is Dr. Lovie Macadamia or [facility]. [facility] [place] Patient stated his wife will drive him to appointments. Confirmed home address listed in chart is correct. Patient denied additional needs at this time.  Patient needs EMS transport home for safety per MD. Completed EMS paperwork. Arranged nonemergent transport with [facility]. Patient is 2nd on the list. RN aware.   Expected Discharge Plan: [place] Barriers to Discharge: Barriers Resolved   Patient Goals and CMS Choice Patient states their goals for this hospitalization and ongoing recovery are:: home with home health CMS Medicare.gov Compare Post Acute Care list provided to:: Patient Choice offered to / list presented to : Patient, Spouse  Expected Discharge Plan and Services Expected Discharge Plan: [place]       Living arrangements for the past 2 months: Single Family Home Expected Discharge Date: 10/05/20               DME Arranged: Gilford Rile platform DME Agency:  Celesta Aver) Date DME Agency  Contacted: 10/05/20   Representative spoke with at DME Agency: [facility] HH Arranged: PT [place]: [place] Date [place]: 10/05/20   Representative spoke with at [place]: [place]  Prior Living Arrangements/Services Living arrangements for the past 2 months: [place] with:: Spouse Patient language and need for interpreter reviewed:: Yes Do you feel safe going back to the place where you live?: Yes      Need for Family Participation in Patient Care: Yes (Comment) Care giver support system in place?: Yes (comment) Current home services: DME Criminal Activity/Legal Involvement Pertinent to Current Situation/Hospitalization: No - Comment as needed  Activities of Daily Living Home Assistive Devices/Equipment: Cane (specify quad or straight) ADL Screening (condition at time of admission) Patient's cognitive ability adequate to safely complete daily activities?: Yes Is the patient deaf or have difficulty hearing?: Yes Does the patient have difficulty seeing, even when wearing glasses/contacts?: No Does the patient have difficulty concentrating, remembering, or making decisions?: No Patient able to express need for assistance with ADLs?: No Does the patient have difficulty dressing or bathing?: No Independently performs ADLs?: Yes (appropriate for developmental age) Does the patient have difficulty walking or climbing stairs?: No Weakness of Legs: None Weakness of Arms/Hands: None  Permission Sought/Granted Permission sought to share information with : Facility Sport and exercise psychologist, Family Supports Permission granted to share information with : Yes, Counsellor  granted to share info w AGENCY: [place] & DME agencies        Emotional Assessment       Orientation: : Oriented to Self, Oriented to Place, Oriented to  Time, Oriented to Situation Alcohol / Substance Use: Not Applicable Psych Involvement: No  (comment)  Admission diagnosis:  Scaphoid fracture [S62.009A] Patient Active Problem List   Diagnosis Date Noted   Scaphoid fracture 10/03/2020   CAD (coronary artery disease) 07/05/2018   Combined hyperlipidemia 07/05/2018   COPD (chronic obstructive pulmonary disease) ([place]) 07/05/2018   Dizziness 07/05/2018   GERD (gastroesophageal reflux disease) 07/05/2018   HTN (hypertension) 07/05/2018   Chronic headaches 06/24/2018   Non-Hodgkin's lymphoma ([place]) 06/24/2018   History of obstructive sleep apnea 06/22/2018   Kidney disease 06/22/2018   PCP:  Juluis Pitch, MD [id number] [place] [address] [place]Phone: [phone]: [phone]    Social Determinants of Health (SDOH) Interventions    Readmission Risk Interventions No flowsheet data found.

## 2020-10-05 NOTE — Progress Notes (Signed)
Patient is A & O x4 and able to make needs known. AVS reviewed with patient who verbalized understanding via teach back re medications, signs and symptoms to notify MD, follow up appointments as well as limitations and restrictions. Patient will be transported home via EMS stretcher. Cephalexin, enoxaparin and [facility]ycodone-acetaminophen [id number].

## 2020-10-06 ENCOUNTER — Encounter: Payer: Self-pay | Admitting: Podiatry

## 2020-10-07 ENCOUNTER — Encounter: Payer: Self-pay | Admitting: Podiatry

## 2020-10-08 ENCOUNTER — Encounter: Payer: Self-pay | Admitting: Podiatry

## 2020-10-09 LAB — AEROBIC/ANAEROBIC CULTURE W GRAM STAIN (SURGICAL/DEEP WOUND)
Culture: NO GROWTH
Gram Stain: NONE SEEN

## 2020-11-21 ENCOUNTER — Ambulatory Visit: Payer: Medicare Other | Admitting: Occupational Therapy

## 2020-12-02 ENCOUNTER — Encounter: Payer: Self-pay | Admitting: Occupational Therapy

## 2020-12-02 ENCOUNTER — Ambulatory Visit: Payer: Medicare Other | Attending: Surgery | Admitting: Occupational Therapy

## 2020-12-02 DIAGNOSIS — M79642 Pain in left hand: Secondary | ICD-10-CM | POA: Diagnosis present

## 2020-12-02 DIAGNOSIS — M6281 Muscle weakness (generalized): Secondary | ICD-10-CM | POA: Insufficient documentation

## 2020-12-02 DIAGNOSIS — M25632 Stiffness of left wrist, not elsewhere classified: Secondary | ICD-10-CM | POA: Insufficient documentation

## 2020-12-02 DIAGNOSIS — M25532 Pain in left wrist: Secondary | ICD-10-CM | POA: Diagnosis present

## 2020-12-02 DIAGNOSIS — M25642 Stiffness of left hand, not elsewhere classified: Secondary | ICD-10-CM | POA: Insufficient documentation
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
## 2020-12-09 ENCOUNTER — Ambulatory Visit: Payer: Medicare Other | Attending: Surgery | Admitting: Occupational Therapy

## 2020-12-09 DIAGNOSIS — M25532 Pain in left wrist: Secondary | ICD-10-CM | POA: Diagnosis present

## 2020-12-09 DIAGNOSIS — M25642 Stiffness of left hand, not elsewhere classified: Secondary | ICD-10-CM | POA: Diagnosis present

## 2020-12-09 DIAGNOSIS — M79642 Pain in left hand: Secondary | ICD-10-CM

## 2020-12-09 DIAGNOSIS — M6281 Muscle weakness (generalized): Secondary | ICD-10-CM

## 2020-12-09 DIAGNOSIS — M25632 Stiffness of left wrist, not elsewhere classified: Secondary | ICD-10-CM
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
## 2020-12-12 ENCOUNTER — Ambulatory Visit: Payer: Medicare Other | Admitting: Occupational Therapy

## 2020-12-12 DIAGNOSIS — M6281 Muscle weakness (generalized): Secondary | ICD-10-CM

## 2020-12-12 DIAGNOSIS — M25642 Stiffness of left hand, not elsewhere classified: Secondary | ICD-10-CM

## 2020-12-12 DIAGNOSIS — M25532 Pain in left wrist: Secondary | ICD-10-CM

## 2020-12-12 DIAGNOSIS — M79642 Pain in left hand: Secondary | ICD-10-CM | POA: Diagnosis not present

## 2020-12-12 DIAGNOSIS — M25632 Stiffness of left wrist, not elsewhere classified: Secondary | ICD-10-CM
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
## 2020-12-16 ENCOUNTER — Ambulatory Visit: Payer: Medicare Other | Admitting: Occupational Therapy

## 2020-12-18 ENCOUNTER — Encounter: Payer: Self-pay | Admitting: Occupational Therapy

## 2020-12-18 ENCOUNTER — Ambulatory Visit: Payer: Medicare Other | Admitting: Occupational Therapy

## 2020-12-18 DIAGNOSIS — M25632 Stiffness of left wrist, not elsewhere classified: Secondary | ICD-10-CM

## 2020-12-18 DIAGNOSIS — M25532 Pain in left wrist: Secondary | ICD-10-CM

## 2020-12-18 DIAGNOSIS — M6281 Muscle weakness (generalized): Secondary | ICD-10-CM

## 2020-12-18 DIAGNOSIS — M79642 Pain in left hand: Secondary | ICD-10-CM | POA: Diagnosis not present

## 2020-12-18 DIAGNOSIS — M25642 Stiffness of left hand, not elsewhere classified: Secondary | ICD-10-CM
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
## 2020-12-19 ENCOUNTER — Encounter: Payer: Self-pay | Admitting: Occupational Therapy
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
## 2023-02-28 ENCOUNTER — Emergency Department: Payer: No Typology Code available for payment source

## 2023-02-28 ENCOUNTER — Observation Stay
Admission: EM | Admit: 2023-02-28 | Discharge: 2023-03-01 | Disposition: A | Discharge status: Home / Self Care | Payer: No Typology Code available for payment source | Attending: Internal Medicine | Admitting: Internal Medicine

## 2023-02-28 DIAGNOSIS — I119 Hypertensive heart disease without heart failure: Secondary | ICD-10-CM | POA: Insufficient documentation

## 2023-02-28 DIAGNOSIS — Z85528 Personal history of other malignant neoplasm of kidney: Secondary | ICD-10-CM | POA: Insufficient documentation

## 2023-02-28 DIAGNOSIS — J449 Chronic obstructive pulmonary disease, unspecified: Secondary | ICD-10-CM | POA: Diagnosis present

## 2023-02-28 DIAGNOSIS — I2510 Atherosclerotic heart disease of native coronary artery without angina pectoris: Secondary | ICD-10-CM | POA: Diagnosis present

## 2023-02-28 DIAGNOSIS — K920 Hematemesis: Principal | ICD-10-CM | POA: Diagnosis present

## 2023-02-28 DIAGNOSIS — I4891 Unspecified atrial fibrillation: Secondary | ICD-10-CM | POA: Diagnosis not present

## 2023-02-28 DIAGNOSIS — H401120 Primary open-angle glaucoma, left eye, stage unspecified: Secondary | ICD-10-CM | POA: Diagnosis present

## 2023-02-28 DIAGNOSIS — Z87891 Personal history of nicotine dependence: Secondary | ICD-10-CM | POA: Diagnosis not present

## 2023-02-28 DIAGNOSIS — K219 Gastro-esophageal reflux disease without esophagitis: Secondary | ICD-10-CM | POA: Diagnosis present

## 2023-02-28 DIAGNOSIS — Z79899 Other long term (current) drug therapy: Secondary | ICD-10-CM | POA: Diagnosis not present

## 2023-02-28 DIAGNOSIS — I10 Essential (primary) hypertension: Secondary | ICD-10-CM | POA: Diagnosis present

## 2023-02-28 DIAGNOSIS — K922 Gastrointestinal hemorrhage, unspecified: Secondary | ICD-10-CM

## 2023-02-28 DIAGNOSIS — Z7982 Long term (current) use of aspirin: Secondary | ICD-10-CM | POA: Insufficient documentation

## 2023-02-28 DIAGNOSIS — Z8572 Personal history of non-Hodgkin lymphomas: Secondary | ICD-10-CM | POA: Diagnosis not present

## 2023-02-28 DIAGNOSIS — R1319 Other dysphagia: Secondary | ICD-10-CM

## 2023-02-28 DIAGNOSIS — R112 Nausea with vomiting, unspecified: Secondary | ICD-10-CM

## 2023-02-28 LAB — CBC WITH DIFFERENTIAL/PLATELET
Abs Immature Granulocytes: 0.03 10*3/uL (ref 0.00–0.07)
Basophils Absolute: 0 10*3/uL (ref 0.0–0.1)
Basophils Relative: 0 %
Eosinophils Absolute: 0.4 10*3/uL (ref 0.0–0.5)
Eosinophils Relative: 5 %
HCT: 41.9 % (ref 39.0–52.0)
Hemoglobin: 14 g/dL (ref 13.0–17.0)
Immature Granulocytes: 0 %
Lymphocytes Relative: 26 %
Lymphs Abs: 2.4 10*3/uL (ref 0.7–4.0)
MCH: 32.4 pg (ref 26.0–34.0)
MCHC: 33.4 g/dL (ref 30.0–36.0)
MCV: 97 fL (ref 80.0–100.0)
Monocytes Absolute: 0.6 10*3/uL (ref 0.1–1.0)
Monocytes Relative: 6 %
Neutro Abs: 5.6 10*3/uL (ref 1.7–7.7)
Neutrophils Relative %: 63 %
Platelets: 237 10*3/uL (ref 150–400)
RBC: 4.32 MIL/uL (ref 4.22–5.81)
RDW: 12.5 % (ref 11.5–15.5)
WBC: 9.1 10*3/uL (ref 4.0–10.5)
nRBC: 0 % (ref 0.0–0.2)

## 2023-02-28 LAB — COMPREHENSIVE METABOLIC PANEL
ALT: 17 U/L (ref 0–44)
AST: 18 U/L (ref 15–41)
Albumin: 3.6 g/dL (ref 3.5–5.0)
Alkaline Phosphatase: 71 U/L (ref 38–126)
Anion gap: 9 (ref 5–15)
BUN: 21 mg/dL (ref 8–23)
CO2: 21 mmol/L — ABNORMAL LOW (ref 22–32)
Calcium: 8.2 mg/dL — ABNORMAL LOW (ref 8.9–10.3)
Chloride: 109 mmol/L (ref 98–111)
Creatinine, Ser: 1.39 mg/dL — ABNORMAL HIGH (ref 0.61–1.24)
GFR, Estimated: 55 mL/min — ABNORMAL LOW (ref >60)
Glucose, Bld: 98 mg/dL (ref 70–99)
Potassium: 3.9 mmol/L (ref 3.5–5.1)
Sodium: 139 mmol/L (ref 135–145)
Total Bilirubin: 0.3 mg/dL (ref <1.2)
Total Protein: 6.2 g/dL — ABNORMAL LOW (ref 6.5–8.1)

## 2023-02-28 LAB — TYPE AND SCREEN
ABO/RH(D): O POS
Antibody Screen: NEGATIVE

## 2023-02-28 MED ORDER — ALUM & MAG HYDROXIDE-SIMETH 200-200-20 MG/5ML PO SUSP
30.0000 mL | Freq: Once | ORAL | Status: AC
  Administered 2023-02-28: 30 mL via ORAL
  Filled 2023-02-28: qty 30

## 2023-02-28 MED ORDER — PANTOPRAZOLE SODIUM 40 MG IV SOLR
40.0000 mg | Freq: Once | INTRAVENOUS | Status: AC
  Administered 2023-02-28: 40 mg via INTRAVENOUS
  Filled 2023-02-28: qty 10

## 2023-02-28 NOTE — Assessment & Plan Note (Signed)
No signs of active bleeding.  Last hemoglobin 14.  EGD showed normal stomach and normal duodenum with an irregular GE junction.  We suspect a Mallory-Weiss tear from numerous episodes of vomiting.  Patient already on PPI twice daily

## 2023-02-28 NOTE — Assessment & Plan Note (Signed)
Can go back on lisinopril as outpatient.

## 2023-02-28 NOTE — Assessment & Plan Note (Signed)
Patient on twice daily PPI

## 2023-02-28 NOTE — Assessment & Plan Note (Signed)
Continue with as needed albuterol, and DuoNeb therapy.  Currently stable.

## 2023-02-28 NOTE — Assessment & Plan Note (Signed)
Stable currently no complaints of chest pain shortness of breath palpitation. Currently patient will be n.p.o.  Home regimen of Lovenox lisinopril Zocor pending

## 2023-02-28 NOTE — H&P (Signed)
History and Physical    Patient: Duane Klein VZD:[id number] DOB: Mar 21, 1950 DOA: 02/28/2023 DOS: the patient was seen and examined on 03/01/2023 PCP: Dorothey Baseman, MD  Patient coming from: Home  Chief Complaint  Patient presents with   Hematemesis    Arrives after 1 episode hematemesis, now c/o burning in chest 5/10, non radiating. Per EMS, moderate amount of BRB in trash can, denies hx of. Endorsing drinking 1 beer, denies daily ETOH.     HPI: Duane Klein is a 72 y.o. male with past medical history  of  Allergy of  gabapentin and tramadol , Non Hodgkin's lymphoma, heart disease, COPD, hypertension, GERD, glaucoma, dyslipidemia presenting with hematemesis after having chest pain and burning in nature nonradiating.  No shortness of breath no fevers or chills or vomiting or diarrhea.     In emergency room vitals trend shows: Vitals:   02/28/23 2119 02/28/23 2130 03/01/23 0100  BP: (!) 135/90 133/78 (!) 145/86  Pulse: 72 76 95  Temp: 98.4 F (36.9 C)    Resp: 20  17  Height: 6\' 2"  (1.88 m)    Weight: 90.3 kg    SpO2: 99% 100% 96%  TempSrc: Oral    BMI (Calculated): 25.54    EKG shows sinus rhythm at 75 with no ST-T wave changes. Chest x-ray done today shows bronchiectasis, scattered fibrosis, chronic bronchitis, no focal consolidation. Labs are notable for : Metabolic panel showing bicarb of 21, creatinine of 1.39 EGFR 45, normal LFTs. CBC is within normal limits. In the ED pt received: Medications  lactated ringers infusion (has no administration in time range)  acetaminophen (TYLENOL) tablet 650 mg (has no administration in time range)    Or  acetaminophen (TYLENOL) suppository 650 mg (has no administration in time range)  ondansetron (ZOFRAN) tablet 4 mg (has no administration in time range)    Or  ondansetron (ZOFRAN) injection 4 mg (has no administration in time range)  hydrALAZINE (APRESOLINE) injection 5 mg (has no administration in time range)   pantoprazole (PROTONIX) injection 40 mg (40 mg Intravenous Given 02/28/23 2152)  alum & mag hydroxide-simeth (MAALOX/MYLANTA) 200-200-20 MG/5ML suspension 30 mL (30 mLs Oral Given 02/28/23 2203)   Review of Systems  Constitutional:  Positive for chills. Negative for fever.  Cardiovascular:  Positive for chest pain.  Gastrointestinal:  Positive for diarrhea, nausea and vomiting.       Hematemesis   Past Medical History:  Diagnosis Date   Atrial fibrillation (HCC)    Cancer of kidney (HCC)    Emphysema lung (HCC)    Hypertension    Past Surgical History:  Procedure Laterality Date   APPENDECTOMY     ORIF SCAPHOID FRACTURE Left 10/03/2020   Procedure: PERCUTANEOUS SCREW FIXATION OF LEFT SCAPHOID FRACTURE.;  Surgeon: Christena Flake, MD;  Location: [facility] ORS;  Service: Orthopedics;  Laterality: Left;   ORIF TOE FRACTURE Right 10/04/2020   Procedure: Open Reduction Interal Fixation NAVICULAR FRACTURE- RT;  Surgeon: Gwyneth Revels, DPM;  Location: [facility] ORS;  Service: Podiatry;  Laterality: Right;   ROTATOR CUFF REPAIR     right   TONSILLECTOMY      reports that he quit smoking about 20 years ago. His smoking use included cigarettes. He started smoking about 55 years ago. He has a 105 pack-year smoking history. He quit smokeless tobacco use about 25 years ago.  His smokeless tobacco use included snuff and chew. He reports current alcohol use. He reports that he does not use drugs.  Allergies  Allergen Reactions   Gabapentin     Makes patient violent   Tramadol Itching    No family history on file.  Prior to Admission medications   Medication Sig Start Date End Date Taking? Authorizing Provider  acetaminophen (TYLENOL) 500 MG tablet Take 500 mg by mouth in the morning and at bedtime. 05/21/06   [provider]  albuterol (PROVENTIL) (2.5 MG/3ML) 0.083% nebulizer solution Take 3 mLs (2.5 mg total) by nebulization every 6 (six) hours as needed for wheezing or shortness of breath.  02/27/19   Minna Antis, MD  albuterol (VENTOLIN HFA) 108 (90 Base) MCG/ACT inhaler Inhale 2 puffs into the lungs every 6 (six) hours as needed for wheezing or shortness of breath. 02/07/20   [provider]  amitriptyline (ELAVIL) 10 MG tablet Take 20 mg by mouth at bedtime. 12/20/19   [provider]  aspirin 81 MG EC tablet Take 81 mg by mouth daily.    [provider]  benzonatate (TESSALON) 100 MG capsule Take 100 mg by mouth 3 (three) times daily as needed for cough.    [provider]  Chlorpheniramine Maleate (CHLOR-TABLETS PO) Take 3 tablets by mouth every 6 (six) hours as needed (allergies).    [provider]  docusate sodium (COLACE) 100 MG capsule Take 100-200 mg by mouth daily.    [provider]  enoxaparin (LOVENOX) 40 MG/0.4ML injection Inject 0.4 mLs (40 mg total) into the skin daily. 10/06/20   Rosetta Posner, DPM  fluticasone (FLONASE) 50 MCG/ACT nasal spray Place 2 sprays into both nostrils in the morning and at bedtime. 07/15/19   [provider]  lisinopril (ZESTRIL) 20 MG tablet Take 10 mg by mouth daily. 03/12/20   [provider]  methocarbamol (ROBAXIN) 750 MG tablet Take 750 mg by mouth every 6 (six) hours as needed for muscle spasms.    [provider]  mometasone [facility]) 220 MCG/INH inhaler Inhale 2 puffs into the lungs daily.    [provider]  Multiple Vitamin (MULTI-VITAMIN) tablet Take 1 tablet by mouth daily.    [provider]  omeprazole (PRILOSEC) 40 MG capsule Take 40 mg by mouth 2 (two) times daily before a meal. 04/08/20   [provider]  ondansetron (ZOFRAN ODT) 4 MG disintegrating tablet Take 1 tablet (4 mg total) by mouth every 8 (eight) hours as needed for nausea or vomiting. 03/08/15   Gayla Doss, MD  predniSONE (DELTASONE) 10 MG tablet Take 1 tablet (10 mg total) by mouth daily. Day 1-3: take 4 tablets PO daily Day 4-6: take 3 tablets PO  daily Day 7-9: take 2 tablets PO daily Day 10-12: take 1 tablet PO daily 02/27/19   Minna Antis, MD  simvastatin (ZOCOR) 20 MG tablet Take 10 mg by mouth at bedtime. 08/15/19   [provider]  terazosin (HYTRIN) 5 MG capsule Take 5 mg by mouth at bedtime. 10/22/19   [provider]  Tiotropium Bromide-Olodaterol 2.5-2.5 MCG/ACT AERS Inhale 2 puffs into the lungs daily.    [provider]  topiramate (TOPAMAX) 100 MG tablet Take 100 mg by mouth at bedtime.    [provider]     Vitals:   02/28/23 2119 02/28/23 2130 03/01/23 0100  BP: (!) 135/90 133/78 (!) 145/86  Pulse: 72 76 95  Resp: 20  17  Temp: 98.4 F (36.9 C)    TempSrc: Oral    SpO2: 99% 100% 96%  Weight: 90.3 kg  Height: 6\' 2"  (1.88 m)     Physical Exam Vitals and nursing note reviewed.  Constitutional:      General: He is not in acute distress. HENT:     Head: Normocephalic and atraumatic.     Right Ear: Hearing normal.     Left Ear: Hearing normal.     Nose: Nose normal. No nasal deformity.     Mouth/Throat:     Lips: Pink.     Tongue: No lesions.     Pharynx: Oropharynx is clear.  Eyes:     General: Lids are normal.     Extraocular Movements: Extraocular movements intact.  Cardiovascular:     Rate and Rhythm: Normal rate and regular rhythm.     Heart sounds: Normal heart sounds.  Pulmonary:     Effort: Pulmonary effort is normal.     Breath sounds: No wheezing.  Abdominal:     General: Bowel sounds are normal. There is no distension.     Palpations: Abdomen is soft. There is no mass.     Tenderness: There is no abdominal tenderness.  Musculoskeletal:     Right lower leg: No edema.     Left lower leg: No edema.  Skin:    General: Skin is warm.  Neurological:     General: No focal deficit present.     Mental Status: He is alert and oriented to person, place, and time.     Cranial Nerves: Cranial nerves 2-12 are intact.  Psychiatric:        Attention and  Perception: Attention normal.        Mood and Affect: Mood normal.        Speech: Speech normal.        Behavior: Behavior normal. Behavior is cooperative.     Labs on Admission: I have personally reviewed following labs and imaging studies Results for orders placed or performed during the hospital encounter of 02/28/23 (from the past 24 hours)  CBC with Differential     Status: None   Collection Time: 02/28/23  9:53 PM  Result Value Ref Range   WBC 9.1 4.0 - 10.5 K/uL   RBC 4.32 4.22 - 5.81 MIL/uL   Hemoglobin 14.0 13.0 - 17.0 g/dL   HCT 21.3 08.6 - 57.8 %   MCV 97.0 80.0 - 100.0 fL   MCH 32.4 26.0 - 34.0 pg   MCHC 33.4 30.0 - 36.0 g/dL   RDW 46.9 62.9 - 52.8 %   Platelets 237 150 - 400 K/uL   nRBC 0.0 0.0 - 0.2 %   Neutrophils Relative % 63 %   Neutro Abs 5.6 1.7 - 7.7 K/uL   Lymphocytes Relative 26 %   Lymphs Abs 2.4 0.7 - 4.0 K/uL   Monocytes Relative 6 %   Monocytes Absolute 0.6 0.1 - 1.0 K/uL   Eosinophils Relative 5 %   Eosinophils Absolute 0.4 0.0 - 0.5 K/uL   Basophils Relative 0 %   Basophils Absolute 0.0 0.0 - 0.1 K/uL   Immature Granulocytes 0 %   Abs Immature Granulocytes 0.03 0.00 - 0.07 K/uL  Comprehensive metabolic panel     Status: Abnormal   Collection Time: 02/28/23  9:53 PM  Result Value Ref Range   Sodium 139 135 - 145 mmol/L   Potassium 3.9 3.5 - 5.1 mmol/L   Chloride 109 98 - 111 mmol/L   CO2 21 (L) 22 - 32 mmol/L   Glucose, Bld 98 70 - 99 mg/dL  BUN 21 8 - 23 mg/dL   Creatinine, Ser 6.44 (H) 0.61 - 1.24 mg/dL   Calcium 8.2 (L) 8.9 - 10.3 mg/dL   Total Protein 6.2 (L) 6.5 - 8.1 g/dL   Albumin 3.6 3.5 - 5.0 g/dL   AST 18 15 - 41 U/L   ALT 17 0 - 44 U/L   Alkaline Phosphatase 71 38 - 126 U/L   Total Bilirubin 0.3 <1.2 mg/dL   GFR, Estimated 55 (L) >60 mL/min   Anion gap 9 5 - 15  Type and screen [facility]     Status: None   Collection Time: 02/28/23  9:53 PM  Result Value Ref Range   ABO/RH(D) O POS    Antibody  Screen NEG    Sample Expiration      03/03/2023,2359 Performed at [facility], [address] [place], [place] [address]     CBC:    Latest Ref Rng & Units 02/28/2023    9:53 PM 09/28/2020    8:49 PM 02/27/2019    4:10 PM  CBC  WBC 4.0 - 10.5 K/uL 9.1  11.9  7.0   Hemoglobin 13.0 - 17.0 g/dL 25.9  56.3  87.5   Hematocrit 39.0 - 52.0 % 41.9  42.0  43.1   Platelets 150 - 400 K/uL 237  206  182    Basic Metabolic Panel: Recent Labs  Lab 02/28/23 2153  NA 139  K 3.9  CL 109  CO2 21*  GLUCOSE 98  BUN 21  CREATININE 1.39*  CALCIUM 8.2*   GFR: Lab Results  Component Value Date   CREATININE 1.39 (H) 02/28/2023   CREATININE 1.40 (H) 09/28/2020   CREATININE 1.44 (H) 02/27/2019   Estimated Creatinine Clearance: 57.5 mL/min (A) (by C-G formula based on SCr of 1.39 mg/dL (H)). Liver Function Tests:    Latest Ref Rng & Units 02/28/2023    9:53 PM 02/27/2019    4:10 PM 02/22/2019    5:47 PM  Hepatic Function  Total Protein 6.5 - 8.1 g/dL 6.2  6.4  6.4   Albumin 3.5 - 5.0 g/dL 3.6  3.2  3.6   AST 15 - 41 U/L 18  32  24   ALT 0 - 44 U/L 17  28  18    Alk Phosphatase 38 - 126 U/L 71  50  58   Total Bilirubin <1.2 mg/dL 0.3  0.6  0.5    Coagulation Profile: No results for input(s): "INR", "PROTIME" in the last 168 hours. Cardiac Enzymes: No results for input(s): "CKTOTAL", "CKMB", "CKMBINDEX", "TROPONINI" in the last 168 hours. BNP (last 3 results) No results for input(s): "PROBNP" in the last 8760 hours. HbA1C: No results for input(s): "HGBA1C" in the last 72 hours. CBG: No results for input(s): "GLUCAP" in the last 168 hours. Lipid Profile: No results for input(s): "CHOL", "HDL", "LDLCALC", "TRIG", "CHOLHDL", "LDLDIRECT" in the last 72 hours. Urinalysis    Component Value Date/Time   COLORURINE YELLOW (A) 03/08/2015 1637   APPEARANCEUR CLEAR (A) 03/08/2015 1637   APPEARANCEUR Clear 09/11/2011 0117   LABSPEC 1.017 03/08/2015 1637   LABSPEC 1.018  09/11/2011 0117   PHURINE 5.0 03/08/2015 1637   GLUCOSEU NEGATIVE 03/08/2015 1637   GLUCOSEU Negative 09/11/2011 0117   HGBUR NEGATIVE 03/08/2015 1637   BILIRUBINUR NEGATIVE 03/08/2015 1637   BILIRUBINUR Negative 09/11/2011 0117   KETONESUR TRACE (A) 03/08/2015 1637   PROTEINUR NEGATIVE 03/08/2015 1637   NITRITE NEGATIVE 03/08/2015 1637   LEUKOCYTESUR  NEGATIVE 03/08/2015 1637   LEUKOCYTESUR Negative 09/11/2011 0117   No results found for this or any previous visit (from the past 720 hours).  Unresulted Labs (From admission, onward)     Start     Ordered   Pending  HIV Antibody (routine testing w rflx)  (HIV Antibody (Routine testing w reflex) panel)  Once,   R        Pending   Pending  Comprehensive metabolic panel  Tomorrow morning,   R        Pending   Pending  CBC  Tomorrow morning,   R        Pending            Radiological Exams on Admission: DG Chest Portable 1 View Result Date: 02/28/2023 CLINICAL DATA:  Nausea and vomiting. EXAM: PORTABLE CHEST 1 VIEW COMPARISON:  02/27/2019 FINDINGS: Heart size and pulmonary vascularity are normal. Scattered fibrosis in the lungs. Central bronchiectasis. No airspace disease or consolidation. No pleural effusions. No pneumothorax. Mediastinal contours appear intact. Calcification of the aorta. IMPRESSION: Central bronchiectasis. Scattered fibrosis. Changes likely chronic bronchitis. No focal consolidation. Electronically Signed   By: Burman Nieves M.D.   On: 02/28/2023 21:46    Data Reviewed: Relevant notes from primary care and specialist visits, past discharge summaries as available in EHR, including Care Everywhere. Prior diagnostic testing as pertinent to current admission diagnoses Updated medications and problem lists for reconciliation ED course, including vitals, labs, imaging, treatment and response to treatment Triage notes, nursing and pharmacy notes and ED provider's notes Notable results as noted in HPI  Assessment  and Plan: * Hematemesis Hematemesis, stable CBC currently. Chest pain resolved will get GI consult for upper GI eval. IV PPI aspiration precaution and swallow eval.  GERD (gastroesophageal reflux disease) Aspiration precaution, IV PPI therapy, GI consult. Dr. Timothy Lasso consulted.  HTN (hypertension) Home regimen of lisinopril held as patient is n.p.o., will start patient on hydralazine as needed  Primary open angle glaucoma, left eye Resume any glaucoma eye drops after med rec I dont see any currently in chart.    COPD (chronic obstructive pulmonary disease) (HCC) Continue with as needed albuterol, and DuoNeb therapy.  Currently stable.  CAD (coronary artery disease) Stable currently no complaints of chest pain shortness of breath palpitation. Currently patient will be n.p.o.  Home regimen of Lovenox lisinopril Zocor pending    DVT prophylaxis:  Heparin   Consults:  None   Advance Care Planning:    Code Status: Full Code   Family Communication:  None   Disposition Plan:  HOME   Severity of Illness: The appropriate patient status for this patient is INPATIENT. Inpatient status is judged to be reasonable and necessary in order to provide the required intensity of service to ensure the patient's safety. The patient's presenting symptoms, physical exam findings, and initial radiographic and laboratory data in the context of their chronic comorbidities is felt to place them at high risk for further clinical deterioration. Furthermore, it is not anticipated that the patient will be medically stable for discharge from the hospital within 2 midnights of admission.   * I certify that at the point of admission it is my clinical judgment that the patient will require inpatient hospital care spanning beyond 2 midnights from the point of admission due to high intensity of service, high risk for further deterioration and high frequency of surveillance required.*  Author: Gertha Calkin,  MD 03/01/2023 1:49 AM  For on call review  [email or web address]   Orders Placed This Encounter  Procedures   DG Chest Portable 1 View    Standing Status:   Standing    Number of Occurrences:   1    Reason for Exam (SYMPTOM  OR DIAGNOSIS REQUIRED):   nausea/vomiting   CBC with Differential    Standing Status:   Standing    Number of Occurrences:   1   Comprehensive metabolic panel    Standing Status:   Standing    Number of Occurrences:   1   Diet NPO time specified    Standing Status:   Standing    Number of Occurrences:   1   SCDs    Standing Status:   Standing    Number of Occurrences:   1    Laterality:   Bilateral   Cardiac Monitoring Continuous x 24 hours Indications for use: Other; other indications for use: Monitor for ischemia    Standing Status:   Standing    Number of Occurrences:   1    Indications for use::   Other    other indications for use::   Monitor for ischemia   Vital signs    Standing Status:   Standing    Number of Occurrences:   1   Notify physician (specify)    Standing Status:   Standing    Number of Occurrences:   20    Notify Physician:   for pulse less than 55 or greater than 120    Notify Physician:   for respiratory rate less than 12 or greater than 25    Notify Physician:   for temperature greater than 100.5 F    Notify Physician:   for urinary output less than 30 mL/hr for four hours    Notify Physician:   for systolic BP less than 90 or greater than 160, diastolic BP less than 60 or greater than 100    Notify Physician:   for new hypoxia w/ oxygen saturations < 88%   Mobility Protocol: No Restrictions    RN to initiate protocols based on patient's level of care    Standing Status:   Standing    Number of Occurrences:   1   Refer to Sidebar Report Refer to ICU, Med-Surg, Progressive, and Step-Down Mobility Protocol Sidebars    Refer to ICU, Med-Surg, Progressive, and Step-Down Mobility Protocol Sidebars    Standing Status:   Standing    Number  of Occurrences:   1   If patient diabetic or glucose greater than 140 notify physician for Sliding Scale Insulin Orders    Standing Status:   Standing    Number of Occurrences:   20   Intake and Output    Standing Status:   Standing    Number of Occurrences:   1   Do not place and if present remove PureWick    Standing Status:   Standing    Number of Occurrences:   1   Initiate Oral Care Protocol    Standing Status:   Standing    Number of Occurrences:   1   Initiate Carrier Fluid Protocol    Standing Status:   Standing    Number of Occurrences:   1   RN may order General Admission PRN Orders utilizing "General Admission PRN medications" (through manage orders) for the following patient needs: allergy symptoms (Claritin), cold sores (Carmex), cough (Robitussin DM), eye irritation (Liquifilm Tears), hemorrhoids (Tucks), indigestion (Maalox), minor skin irritation (Hydrocortisone  Cream), muscle pain Crista Elliot), nose irritation (saline nasal spray) and sore throat (Chloraseptic spray).    Standing Status:   Standing    Number of Occurrences:   [phone]   Full code    Standing Status:   Standing    Number of Occurrences:   1    By::   Other   Consult to hospitalist    Standing Status:   Standing    Number of Occurrences:   1    Place call to::   [phone]    Reason for Consult:   Admit   Consult to gastroenterology Consult Timeframe: ROUTINE - requires response within 24 hours; Reason for Consult? hematemesis / dyspahgia / esophageal stricture.    Standing Status:   Standing    Number of Occurrences:   1    Consult Timeframe:   ROUTINE - requires response within 24 hours    Reason for Consult?:   hematemesis / dyspahgia / esophageal stricture.   Pulse oximetry check with vital signs    Standing Status:   Standing    Number of Occurrences:   1   Oxygen therapy Mode or (Route): Nasal cannula; Liters Per Minute: 2; Keep O2 saturation between: greater than 92 %    Standing Status:   Standing     Number of Occurrences:   1    Mode or (Route):   Nasal cannula    Liters Per Minute:   2    Keep O2 saturation between:   greater than 92 %   EKG 12-Lead    Standing Status:   Standing    Number of Occurrences:   1   Type and screen [facility]    [facility]     Standing Status:   Standing    Number of Occurrences:   1   Admit to Inpatient (patient's expected length of stay will be greater than 2 midnights or inpatient only procedure)    Standing Status:   Standing    Number of Occurrences:   1    Hospital Area:   [facility] [[id number]]    Level of Care:   Progressive [102]    Admit to Progressive based on following criteria:   GI, ENDOCRINE disease patients with GI bleeding, acute liver failure or pancreatitis, stable with diabetic ketoacidosis or thyrotoxicosis (hypothyroid) state.    Covid Evaluation:   Asymptomatic - no recent exposure (last 10 days) testing not required    Diagnosis:   Hematemesis [578.0.ICD-9-CM]    Admitting Physician:   Darrold Junker    Attending Physician:   Darrold Junker    Certification::   I certify this patient will need inpatient services for at least 2 midnights    Expected Medical Readiness:   03/03/2023   Aspiration precautions    Standing Status:   Standing    Number of Occurrences:   1   Fall precautions    Standing Status:   Standing    Number of Occurrences:   1

## 2023-02-28 NOTE — ED Provider Notes (Signed)
[facility] Provider Note    Event Date/Time   First MD Initiated Contact with Patient 02/28/23 2112     (approximate)   History   Hematemesis (Arrives after 1 episode hematemesis, now c/o burning in chest 5/10, non radiating. Per EMS, moderate amount of BRB in trash can, denies hx of. Endorsing drinking 1 beer, denies daily ETOH. )   HPI  Duane Klein is a 72 y.o. male with a history of reflux and esophagitis as well as esophageal stricture requiring endoscopy and dilatation at the [place] presents to the ER for evaluation of hematemesis.  States he was eating steak felt like the steak got stuck.  He did vomit after and has been having blood streaks in his vomit since.  No coffee ground emesis.  No melena.  He is not on any blood thinners.  Does take Protonix.     Physical Exam   Triage Vital Signs: ED Triage Vitals  Encounter Vitals Group     BP      Systolic BP Percentile      Diastolic BP Percentile      Pulse      Resp      Temp      Temp src      SpO2      Weight      Height      Head Circumference      Peak Flow      Pain Score      Pain Loc      Pain Education      Exclude from Growth Chart     Most recent vital signs: Vitals:   02/28/23 2119 02/28/23 2130  BP: (!) 135/90 133/78  Pulse: 72 76  Resp: 20   Temp: 98.4 F (36.9 C)   SpO2: 99% 100%     Constitutional: Alert  Eyes: Conjunctivae are normal.  Head: Atraumatic. Nose: No congestion/rhinnorhea. Mouth/Throat: Mucous membranes are moist.   Neck: Painless ROM.  Cardiovascular:   Good peripheral circulation. Respiratory: Normal respiratory effort.  No retractions.  Gastrointestinal: Soft and nontender.  Musculoskeletal:  no deformity Neurologic:  MAE spontaneously. No gross focal neurologic deficits are appreciated.  Skin:  Skin is warm, dry and intact. No rash noted. Psychiatric: Mood and affect are normal. Speech and behavior are normal.    ED Results /  Procedures / Treatments   Labs (all labs ordered are listed, but only abnormal results are displayed) Labs Reviewed  COMPREHENSIVE METABOLIC PANEL - Abnormal; Notable for the following components:      Result Value   CO2 21 (*)    Creatinine, Ser 1.39 (*)    Calcium 8.2 (*)    Total Protein 6.2 (*)    GFR, Estimated 55 (*)    All other components within normal limits  CBC WITH DIFFERENTIAL/PLATELET  TYPE AND SCREEN     EKG  ED ECG REPORT I, Willy Eddy, the attending physician, personally viewed and interpreted this ECG.   Date: 02/28/2023  EKG Time: 21:20  Rate: 75  Rhythm: sinus  Axis: normal  Intervals: normal  ST&T Change: no stemi, no depressions    RADIOLOGY Please see ED Course for my review and interpretation.  I personally reviewed all radiographic images ordered to evaluate for the above acute complaints and reviewed radiology reports and findings.  These findings were personally discussed with the patient.  Please see medical record for radiology report.    PROCEDURES:  Critical  Care performed: No  Procedures   MEDICATIONS ORDERED IN ED: Medications  pantoprazole (PROTONIX) injection 40 mg (40 mg Intravenous Given 02/28/23 2152)  alum & mag hydroxide-simeth (MAALOX/MYLANTA) 200-200-20 MG/5ML suspension 30 mL (30 mLs Oral Given 02/28/23 2203)     IMPRESSION / MDM / ASSESSMENT AND PLAN / ED COURSE  I reviewed the triage vital signs and the nursing notes.                              Differential diagnosis includes, but is not limited to, food bolus impaction, esophagitis, Boerhaave's, ACS, pneumothorax, mediastinitis, esophageal stricture  Patient presenting to the ER for evaluation of symptoms as described above.  Based on symptoms, risk factors and considered above differential, this presenting complaint could reflect a potentially life-threatening illness therefore the patient will be placed on continuous pulse oximetry and telemetry for  monitoring.  Laboratory evaluation will be sent to evaluate for the above complaints.      Clinical Course as of 02/28/23 2319  Wynelle Link Feb 28, 2023  2318 Patient tolerated GI cocktail.  Blood work is otherwise reassuring but given his history and episodes of hematemesis I do believe that he would benefit from observation in the hospital for serial H&H and possible GI consultation in the morning.  Will consult hospitalist for admission. [PR]    Clinical Course User Index [PR] Willy Eddy, MD     FINAL CLINICAL IMPRESSION(S) / ED DIAGNOSES   Final diagnoses:  Hematemesis with nausea     Rx / DC Orders   ED Discharge Orders     None        Note:  This document was prepared using Dragon voice recognition software and may include unintentional dictation errors.    Willy Eddy, MD 02/28/23 [phone]

## 2023-03-01 ENCOUNTER — Inpatient Hospital Stay: Payer: No Typology Code available for payment source | Admitting: Anesthesiology

## 2023-03-01 ENCOUNTER — Other Ambulatory Visit: Payer: Self-pay

## 2023-03-01 ENCOUNTER — Encounter
Admission: EM | Disposition: A | Discharge status: Home / Self Care | Payer: Self-pay | Source: Home / Self Care | Attending: Internal Medicine

## 2023-03-01 ENCOUNTER — Encounter: Payer: Self-pay | Admitting: Internal Medicine

## 2023-03-01 DIAGNOSIS — I2510 Atherosclerotic heart disease of native coronary artery without angina pectoris: Secondary | ICD-10-CM

## 2023-03-01 DIAGNOSIS — R1310 Dysphagia, unspecified: Secondary | ICD-10-CM | POA: Diagnosis not present

## 2023-03-01 DIAGNOSIS — K219 Gastro-esophageal reflux disease without esophagitis: Secondary | ICD-10-CM | POA: Diagnosis not present

## 2023-03-01 DIAGNOSIS — K922 Gastrointestinal hemorrhage, unspecified: Secondary | ICD-10-CM

## 2023-03-01 DIAGNOSIS — R112 Nausea with vomiting, unspecified: Secondary | ICD-10-CM | POA: Diagnosis not present

## 2023-03-01 DIAGNOSIS — J439 Emphysema, unspecified: Secondary | ICD-10-CM

## 2023-03-01 DIAGNOSIS — I10 Essential (primary) hypertension: Secondary | ICD-10-CM

## 2023-03-01 DIAGNOSIS — R1319 Other dysphagia: Secondary | ICD-10-CM

## 2023-03-01 DIAGNOSIS — K920 Hematemesis: Secondary | ICD-10-CM | POA: Diagnosis not present

## 2023-03-01 HISTORY — PX: ESOPHAGOGASTRODUODENOSCOPY (EGD) WITH PROPOFOL: SHX5813

## 2023-03-01 SURGERY — ESOPHAGOGASTRODUODENOSCOPY (EGD) WITH PROPOFOL
Anesthesia: General

## 2023-03-01 MED ORDER — ACETAMINOPHEN 325 MG RE SUPP: 650.0000 mg | Freq: Four times a day (QID) | RECTAL | Status: DC | PRN

## 2023-03-01 MED ORDER — LACTATED RINGERS IV SOLN: Freq: Once | INTRAVENOUS | Status: AC

## 2023-03-01 MED ORDER — LIDOCAINE HCL (PF) 2 % IJ SOLN
INTRAMUSCULAR | Status: AC
  Filled 2023-03-01: qty 5

## 2023-03-01 MED ORDER — ONDANSETRON HCL 4 MG PO TABS: 4.0000 mg | ORAL_TABLET | Freq: Four times a day (QID) | ORAL | Status: DC | PRN

## 2023-03-01 MED ORDER — PROPOFOL 1000 MG/100ML IV EMUL
INTRAVENOUS | Status: AC
  Filled 2023-03-01: qty 100

## 2023-03-01 MED ORDER — LIDOCAINE HCL (CARDIAC) PF 100 MG/5ML IV SOSY
PREFILLED_SYRINGE | INTRAVENOUS | Status: DC | PRN
  Administered 2023-03-01: 100 mg via INTRAVENOUS

## 2023-03-01 MED ORDER — PANTOPRAZOLE SODIUM 40 MG IV SOLR
40.0000 mg | Freq: Two times a day (BID) | INTRAVENOUS | Status: DC
  Administered 2023-03-01: 40 mg via INTRAVENOUS
  Filled 2023-03-01: qty 10

## 2023-03-01 MED ORDER — PROPOFOL 10 MG/ML IV BOLUS
INTRAVENOUS | Status: DC | PRN
  Administered 2023-03-01: 100 mg via INTRAVENOUS

## 2023-03-01 MED ORDER — HYDRALAZINE HCL 20 MG/ML IJ SOLN: 5.0000 mg | INTRAMUSCULAR | Status: DC | PRN

## 2023-03-01 MED ORDER — SODIUM CHLORIDE 0.9 % IV SOLN: INTRAVENOUS | Status: DC | PRN

## 2023-03-01 MED ORDER — PROPOFOL 500 MG/50ML IV EMUL
INTRAVENOUS | Status: DC | PRN
  Administered 2023-03-01: 150 ug/kg/min via INTRAVENOUS

## 2023-03-01 MED ORDER — ACETAMINOPHEN 325 MG PO TABS: 650.0000 mg | ORAL_TABLET | Freq: Four times a day (QID) | ORAL | Status: DC | PRN

## 2023-03-01 MED ORDER — ONDANSETRON HCL 4 MG/2ML IJ SOLN: 4.0000 mg | Freq: Four times a day (QID) | INTRAMUSCULAR | Status: DC | PRN

## 2023-03-01 NOTE — OR Nursing (Signed)
Patient discharged to [facility] by Wheelchair at 14:15.  Wife was on premises trying to find Sales executive.  After 15 minutes, attempted to bring patient upstairs to Endo Unit until wife could find [employer].  Patient refused and jumped out of wheelchair, stating "Just leave me down here in the Lobby to wait".  Explained that because of sedation, that could not be done.  Patient then stated, "Then I am going against the rules" and walked out the door while supposedly talking to his wife on th phone.  Valet Staff stated he walked away very quickly toward road.  Charge Nurse notified.   Shon Hough, RN

## 2023-03-01 NOTE — OR Nursing (Signed)
Just called patient on his cellphone and he answered.  He stated that he found his wife in the [facility] Parking Lot.  Also stated that he is doing fine and is safe at home.  He apologized for walking out and going against the rules.   Shon Hough, RN

## 2023-03-01 NOTE — Op Note (Signed)
[facility] Gastroenterology Patient Name: Duane Klein Procedure Date: 03/01/2023 12:32 PM MRN: [id number] Account #: [id number] Date of Birth: [birth year] Admit Type: Inpatient Age: 72 Room: [facility] ENDO ROOM 4 Gender: Male Note Status: Finalized Instrument Name: Upper Endoscope [id number] Procedure:             Upper GI endoscopy Indications:           Dysphagia, Hematemesis Providers:             [name] MD, MD Referring MD:          Teena Irani. Terance Hart, MD (Referring MD) Medicines:             Propofol per Anesthesia Complications:         No immediate complications. Procedure:             Pre-Anesthesia Assessment:                        - Prior to the procedure, a History and Physical was                         performed, and patient medications and allergies were                         reviewed. The patient's tolerance of previous                         anesthesia was also reviewed. The risks and benefits                         of the procedure and the sedation options and risks                         were discussed with the patient. All questions were                         answered, and informed consent was obtained. Prior                         Anticoagulants: The patient has taken no anticoagulant                         or antiplatelet agents. ASA Grade Assessment: II - A                         patient with mild systemic disease. After reviewing                         the risks and benefits, the patient was deemed in                         satisfactory condition to undergo the procedure.                        After obtaining informed consent, the endoscope was                         passed under direct vision. Throughout the procedure,  the patient's blood pressure, pulse, and oxygen                         saturations were monitored continuously. The Endoscope                         was introduced through the mouth,  and advanced to the                         second part of duodenum. The upper GI endoscopy was                         accomplished without difficulty. The patient tolerated                         the procedure well. Findings:      The Z-line was irregular and was found at the gastroesophageal junction.      The stomach was normal.      The examined duodenum was normal. Impression:            - Z-line irregular, at the gastroesophageal junction.                        - Normal stomach.                        - Normal examined duodenum.                        - No specimens collected. Recommendation:        - Return patient to hospital ward for ongoing care.                        - Mechanical soft diet.                        - Continue present medications. Procedure Code(s):     --- Professional ---                        [phone], Esophagogastroduodenoscopy, flexible,                         transoral; diagnostic, including collection of                         specimen(s) by brushing or washing, when performed                         (separate procedure) Diagnosis Code(s):     --- Professional ---                        K92.0, Hematemesis CPT copyright 2022 American Medical Association. All rights reserved. The codes documented in this report are preliminary and upon coder review may  be revised to meet current compliance requirements. [name] MD, MD 03/01/2023 12:52:00 PM This report has been signed electronically. Number of Addenda: 0 Note Initiated On: 03/01/2023 12:32 PM Estimated Blood Loss:  Estimated blood loss: none.      [facility]

## 2023-03-01 NOTE — Transfer of Care (Signed)
Immediate Anesthesia Transfer of Care Note  Patient: Duane Klein  Procedure(s) Performed: ESOPHAGOGASTRODUODENOSCOPY (EGD) WITH PROPOFOL  Patient Location: PACU  Anesthesia Type:General  Level of Consciousness: awake and sedated  Airway & Oxygen Therapy: Patient Spontanous Breathing and Patient connected to nasal cannula oxygen  Post-op Assessment: Report given to RN and Post -op Vital signs reviewed and stable  Post vital signs: Reviewed and stable  Last Vitals:  Vitals Value Taken Time  BP    Temp    Pulse    Resp 19 03/01/23 1251  SpO2    Vitals shown include unfiled device data.  Last Pain:  Vitals:   03/01/23 1030  TempSrc: Oral  PainSc:          Complications: There were no known notable events for this encounter.

## 2023-03-01 NOTE — Hospital Course (Signed)
72 year old man past medical history of non-Hodgkin's lymphoma, heart disease, COPD, hypertension, GERD, glaucoma, hyperlipidemia presented with hematemesis.  He vomited numerous times with blood.  His hemoglobin remained stable.  12/23.  Dr. Servando Snare took to the endoscopy suite and performed an endoscopy which showed a normal stomach, normal duodenum and irregular Z-line at the GE junction.  Recommended mechanical soft diet and discharged home.  Patient already on omeprazole twice a day.

## 2023-03-01 NOTE — Assessment & Plan Note (Signed)
Patient on as needed Zofran at home

## 2023-03-01 NOTE — Discharge Summary (Signed)
Physician Discharge Summary   Patient: Duane Klein MRN: [id number] DOB: [birth year]  Admit date:     02/28/2023  Discharge date: 03/01/23  Discharge Physician: Alford Highland   PCP: Dorothey Baseman, MD   Recommendations at discharge:   Follow-up PCP 5 days  Discharge Diagnoses: Principal Problem:   Hematemesis Active Problems:   GERD (gastroesophageal reflux disease)   HTN (hypertension)   CAD (coronary artery disease)   COPD (chronic obstructive pulmonary disease) (HCC)   Primary open angle glaucoma, left eye   Esophageal dysphagia   Nausea and vomiting   Upper GI bleed    Hospital Course: 72 year old man past medical history of non-Hodgkin's lymphoma, heart disease, COPD, hypertension, GERD, glaucoma, hyperlipidemia presented with hematemesis.  He vomited numerous times with blood.  His hemoglobin remained stable.  12/23.  Dr. Servando Snare took to the endoscopy suite and performed an endoscopy which showed a normal stomach, normal duodenum and irregular Z-line at the GE junction.  Recommended mechanical soft diet and discharged home.  Patient already on omeprazole twice a day.  Assessment and Plan: * Hematemesis No signs of active bleeding.  Last hemoglobin 14.  EGD showed normal stomach and normal duodenum with an irregular GE junction.  We suspect a Mallory-Weiss tear from numerous episodes of vomiting.  Patient already on PPI twice daily  GERD (gastroesophageal reflux disease) Patient on twice daily PPI  HTN (hypertension) Can go back on lisinopril as outpatient  Nausea and vomiting Patient on as needed Zofran at home  Primary open angle glaucoma, left eye    COPD (chronic obstructive pulmonary disease) (HCC) Continue usual inhalers  CAD (coronary artery disease) Patient will hold aspirin for few days and can go back on afterwards.  Continue Zocor.         Consultants: Gastroenterology Procedures performed: EGD Disposition: Home Diet recommendation:   Soft diet DISCHARGE MEDICATION: Allergies as of 03/01/2023       Reactions   Gabapentin    Makes patient violent   Tramadol Itching        Medication List     STOP taking these medications    aspirin EC 81 MG tablet       TAKE these medications    acetaminophen 500 MG tablet Commonly known as: TYLENOL Take 500 mg by mouth in the morning and at bedtime.   albuterol (2.5 MG/3ML) 0.083% nebulizer solution Commonly known as: PROVENTIL Take 3 mLs (2.5 mg total) by nebulization every 6 (six) hours as needed for wheezing or shortness of breath.   albuterol 108 (90 Base) MCG/ACT inhaler Commonly known as: VENTOLIN HFA Inhale 2 puffs into the lungs every 6 (six) hours as needed for wheezing or shortness of breath.   amitriptyline 10 MG tablet Commonly known as: ELAVIL Take 20 mg by mouth at bedtime.   benzonatate 100 MG capsule Commonly known as: TESSALON Take 100 mg by mouth 3 (three) times daily as needed for cough.   CHLOR-TABLETS PO Take 3 tablets by mouth every 6 (six) hours as needed (allergies).   docusate sodium 100 MG capsule Commonly known as: COLACE Take 100-200 mg by mouth daily.   fluticasone 50 MCG/ACT nasal spray Commonly known as: FLONASE Place 2 sprays into both nostrils in the morning and at bedtime.   lisinopril 20 MG tablet Commonly known as: ZESTRIL Take 10 mg by mouth daily.   methocarbamol 750 MG tablet Commonly known as: ROBAXIN Take 750 mg by mouth every 6 (six) hours as needed for muscle  spasms.   mometasone 220 MCG/INH inhaler Commonly known as: ASMANEX Inhale 2 puffs into the lungs daily.   Multi-Vitamin tablet Take 1 tablet by mouth daily.   omeprazole 40 MG capsule Commonly known as: PRILOSEC Take 40 mg by mouth 2 (two) times daily before a meal.   ondansetron 4 MG disintegrating tablet Commonly known as: Zofran ODT Take 1 tablet (4 mg total) by mouth every 8 (eight) hours as needed for nausea or vomiting.    simvastatin 20 MG tablet Commonly known as: ZOCOR Take 10 mg by mouth at bedtime.   terazosin 5 MG capsule Commonly known as: HYTRIN Take 5 mg by mouth at bedtime.   Tiotropium Bromide-Olodaterol 2.5-2.5 MCG/ACT Aers Inhale 2 puffs into the lungs daily.   topiramate 100 MG tablet Commonly known as: TOPAMAX Take 100 mg by mouth at bedtime.        Follow-up Information     Dorothey Baseman, MD Follow up in 5 day(s).   Specialty: Family Medicine Contact information: [address] [place] [address] [phone]                Discharge Exam: Ceasar Mons Weights   02/28/23 2119  Weight: 90.3 kg   Physical Exam HENT:     Head: Normocephalic.     Mouth/Throat:     Pharynx: No oropharyngeal exudate.  Eyes:     General: Lids are normal.     Conjunctiva/sclera: Conjunctivae normal.  Cardiovascular:     Rate and Rhythm: Normal rate and regular rhythm.     Heart sounds: Normal heart sounds, S1 normal and S2 normal.  Pulmonary:     Breath sounds: No decreased breath sounds, wheezing, rhonchi or rales.  Abdominal:     Palpations: Abdomen is soft.     Tenderness: There is no abdominal tenderness.  Musculoskeletal:     Right lower leg: No swelling.     Left lower leg: No swelling.  Skin:    General: Skin is warm.     Findings: No rash.  Neurological:     Mental Status: He is alert and oriented to person, place, and time.      Condition at discharge: stable  The results of significant diagnostics from this hospitalization (including imaging, microbiology, ancillary and laboratory) are listed below for reference.   Imaging Studies: DG Chest Portable 1 View Result Date: 02/28/2023 CLINICAL DATA:  Nausea and vomiting. EXAM: PORTABLE CHEST 1 VIEW COMPARISON:  02/27/2019 FINDINGS: Heart size and pulmonary vascularity are normal. Scattered fibrosis in the lungs. Central bronchiectasis. No airspace disease or consolidation. No pleural effusions. No pneumothorax.  Mediastinal contours appear intact. Calcification of the aorta. IMPRESSION: Central bronchiectasis. Scattered fibrosis. Changes likely chronic bronchitis. No focal consolidation. Electronically Signed   By: Burman Nieves M.D.   On: 02/28/2023 21:46     Labs: CBC: Recent Labs  Lab 02/28/23 2153  WBC 9.1  NEUTROABS 5.6  HGB 14.0  HCT 41.9  MCV 97.0  PLT 237   Basic Metabolic Panel: Recent Labs  Lab 02/28/23 2153  NA 139  K 3.9  CL 109  CO2 21*  GLUCOSE 98  BUN 21  CREATININE 1.39*  CALCIUM 8.2*   Liver Function Tests: Recent Labs  Lab 02/28/23 2153  AST 18  ALT 17  ALKPHOS 71  BILITOT 0.3  PROT 6.2*  ALBUMIN 3.6   Discharge time spent: greater than 30 minutes.  Signed: Alford Highland, MD [facility] 03/01/2023

## 2023-03-01 NOTE — Consult Note (Signed)
Duane Minium, MD [facility]  [address] [place], [place] [address] Phone: [phone] Fax : [phone]  Consultation  Referring Provider:     Dr. Allena Katz Primary Care Physician:  Duane Baseman, MD Primary Gastroenterologist: [facility]         Reason for Consultation:     Dysphagia and GI bleeding  Date of Admission:  02/28/2023 Date of Consultation:  03/01/2023         HPI:   Duane Klein is a 72 y.o. male who was admitted yesterday with a history of dysphagia.  The patient states he follows at the [place] and has had his esophagus stretched.  The patient reports that he has an upcoming appoint with them.  He was eating steak yesterday and had the steak get stuck in his esophagus.  The patient vomited the steak up after retching multiple times the steak came up and shortly after that some blood came up with it.  The patient reports that he was worried so he came to the hospital.  The patient was found to have a normal hemoglobin on admission and reports that he has some rib pain from retching.  There is no report of any black stools or bloody stools.  Patient reports that he no longer smokes and he does not drink except an occasional beer.  Past Medical History:  Diagnosis Date   Atrial fibrillation (HCC)    Cancer of kidney (HCC)    Emphysema lung (HCC)    Hypertension     Past Surgical History:  Procedure Laterality Date   APPENDECTOMY     ORIF SCAPHOID FRACTURE Left 10/03/2020   Procedure: PERCUTANEOUS SCREW FIXATION OF LEFT SCAPHOID FRACTURE.;  Surgeon: Christena Flake, MD;  Location: [facility] ORS;  Service: Orthopedics;  Laterality: Left;   ORIF TOE FRACTURE Right 10/04/2020   Procedure: Open Reduction Interal Fixation NAVICULAR FRACTURE- RT;  Surgeon: Gwyneth Revels, DPM;  Location: [facility] ORS;  Service: Podiatry;  Laterality: Right;   ROTATOR CUFF REPAIR     right   TONSILLECTOMY      Prior to Admission medications   Medication Sig Start Date End Date Taking?  Authorizing Provider  acetaminophen (TYLENOL) 500 MG tablet Take 500 mg by mouth in the morning and at bedtime. 05/21/06   [provider]  albuterol (PROVENTIL) (2.5 MG/3ML) 0.083% nebulizer solution Take 3 mLs (2.5 mg total) by nebulization every 6 (six) hours as needed for wheezing or shortness of breath. 02/27/19   Minna Antis, MD  albuterol (VENTOLIN HFA) 108 (90 Base) MCG/ACT inhaler Inhale 2 puffs into the lungs every 6 (six) hours as needed for wheezing or shortness of breath. 02/07/20   [provider]  amitriptyline (ELAVIL) 10 MG tablet Take 20 mg by mouth at bedtime. 12/20/19   [provider]  aspirin 81 MG EC tablet Take 81 mg by mouth daily.    [provider]  benzonatate (TESSALON) 100 MG capsule Take 100 mg by mouth 3 (three) times daily as needed for cough.    [provider]  Chlorpheniramine Maleate (CHLOR-TABLETS PO) Take 3 tablets by mouth every 6 (six) hours as needed (allergies).    [provider]  docusate sodium (COLACE) 100 MG capsule Take 100-200 mg by mouth daily.    [provider]  enoxaparin (LOVENOX) 40 MG/0.4ML injection Inject 0.4 mLs (40 mg total) into the skin daily. 10/06/20   Rosetta Posner, DPM  fluticasone (FLONASE) 50 MCG/ACT  nasal spray Place 2 sprays into both nostrils in the morning and at bedtime. 07/15/19   [provider]  lisinopril (ZESTRIL) 20 MG tablet Take 10 mg by mouth daily. 03/12/20   [provider]  methocarbamol (ROBAXIN) 750 MG tablet Take 750 mg by mouth every 6 (six) hours as needed for muscle spasms.    [provider]  mometasone [facility]) 220 MCG/INH inhaler Inhale 2 puffs into the lungs daily.    [provider]  Multiple Vitamin (MULTI-VITAMIN) tablet Take 1 tablet by mouth daily.    [provider]  omeprazole (PRILOSEC) 40 MG capsule Take 40 mg by mouth 2 (two) times daily before a meal. 04/08/20   [provider]   ondansetron (ZOFRAN ODT) 4 MG disintegrating tablet Take 1 tablet (4 mg total) by mouth every 8 (eight) hours as needed for nausea or vomiting. 03/08/15   Gayla Doss, MD  predniSONE (DELTASONE) 10 MG tablet Take 1 tablet (10 mg total) by mouth daily. Day 1-3: take 4 tablets PO daily Day 4-6: take 3 tablets PO daily Day 7-9: take 2 tablets PO daily Day 10-12: take 1 tablet PO daily 02/27/19   Minna Antis, MD  simvastatin (ZOCOR) 20 MG tablet Take 10 mg by mouth at bedtime. 08/15/19   [provider]  terazosin (HYTRIN) 5 MG capsule Take 5 mg by mouth at bedtime. 10/22/19   [provider]  Tiotropium Bromide-Olodaterol 2.5-2.5 MCG/ACT AERS Inhale 2 puffs into the lungs daily.    [provider]  topiramate (TOPAMAX) 100 MG tablet Take 100 mg by mouth at bedtime.    [provider]    History reviewed. No pertinent family history.   Social History   Tobacco Use   Smoking status: Former    Current packs/day: 0.00    Average packs/day: 3.0 packs/day for 35.0 years (105.0 ttl pk-yrs)    Types: Cigarettes    Start date: 04/10/1967    Quit date: 04/09/2002    Years since quitting: 20.9   Smokeless tobacco: Former    Types: Snuff, Dorna Bloom    Quit date: 03/09/1997   Tobacco comments:    patient has not smoked since 2004  Vaping Use   Vaping status: Never Used  Substance Use Topics   Alcohol use: Yes    Comment: occ   Drug use: No    Allergies as of 02/28/2023 - Review Complete 02/28/2023  Allergen Reaction Noted   Gabapentin  03/08/2015   Tramadol Itching 03/08/2015    Review of Systems:    All systems reviewed and negative except where noted in HPI.   Physical Exam:  Vital signs in last 24 hours: Temp:  [98 F (36.7 C)-99 F (37.2 C)] 98.2 F (36.8 C) (12/23 1030) Pulse Rate:  [67-95] 78 (12/23 1152) Resp:  [12-20] 18 (12/23 1152) BP: (133-155)/(71-102) 155/102 (12/23 1152) SpO2:  [93 %-100 %] 95 % (12/23 1152) Weight:  [90.3 kg]  90.3 kg (12/22 2119) Last BM Date : 02/28/23 General:   Pleasant, cooperative in NAD Head:  Normocephalic and atraumatic. Eyes:   No icterus.   Conjunctiva pink. PERRLA. Ears:  Normal auditory acuity. Neck:  Supple; no masses or thyroidomegaly Lungs: Respirations even and unlabored. Lungs clear to auscultation bilaterally.   No wheezes, crackles, or rhonchi.  Heart:  Regular rate and rhythm;  Without murmur, clicks, rubs or gallops Abdomen:  Soft, nondistended, nontender. Normal bowel sounds. No appreciable masses or hepatomegaly.  No rebound or guarding.  Rectal:  Not performed. Msk:  Symmetrical without gross deformities.    Extremities:  Without edema, cyanosis or clubbing. Neurologic:  Alert and oriented x3;  grossly normal neurologically. Skin:  Intact without significant lesions or rashes. Cervical Nodes:  No significant cervical adenopathy. Psych:  Alert and cooperative. Normal affect.  LAB RESULTS: Recent Labs    02/28/23 2153  WBC 9.1  HGB 14.0  HCT 41.9  PLT 237   BMET Recent Labs    02/28/23 2153  NA 139  K 3.9  CL 109  CO2 21*  GLUCOSE 98  BUN 21  CREATININE 1.39*  CALCIUM 8.2*   LFT Recent Labs    02/28/23 2153  PROT 6.2*  ALBUMIN 3.6  AST 18  ALT 17  ALKPHOS 71  BILITOT 0.3   PT/INR No results for input(s): "LABPROT", "INR" in the last 72 hours.  STUDIES: DG Chest Portable 1 View Result Date: 02/28/2023 CLINICAL DATA:  Nausea and vomiting. EXAM: PORTABLE CHEST 1 VIEW COMPARISON:  02/27/2019 FINDINGS: Heart size and pulmonary vascularity are normal. Scattered fibrosis in the lungs. Central bronchiectasis. No airspace disease or consolidation. No pleural effusions. No pneumothorax. Mediastinal contours appear intact. Calcification of the aorta. IMPRESSION: Central bronchiectasis. Scattered fibrosis. Changes likely chronic bronchitis. No focal consolidation. Electronically Signed   By: Burman Nieves M.D.   On: 02/28/2023 21:46      Impression  / Plan:   Assessment: Principal Problem:   Hematemesis Active Problems:   CAD (coronary artery disease)   COPD (chronic obstructive pulmonary disease) (HCC)   GERD (gastroesophageal reflux disease)   HTN (hypertension)   Primary open angle glaucoma, left eye   Duane Klein is a 72 y.o. y/o male with with a history of dysphagia and hematemesis who came in yesterday after vomiting and retching when steak got stuck in his esophagus.  The patient has had a normal hemoglobin.  Plan:  The patient will be brought to the endoscopy unit for an upper endoscopy.  The patient has been told that due to his recent bleeding I would not recommend any dilation at this time thereby confusing the picture of what may be the source of his GI bleeding.  The patient has been told the risks and benefits of the procedure and agrees to proceed with the procedure.  The patient most likely has a Mallory-Weiss tear due to his recurrent retching when the food bolus got stuck.  The patient has been explained the plan and agrees with it.  Thank you for involving me in the care of this patient.      LOS: 0 days   Duane Minium, MD, [facility] 03/01/2023, 11:54 AM,  Pager [phone] 7am-5pm  Check AMION for 5pm -7am coverage and on weekends   Note: This dictation was prepared with Dragon dictation along with smaller phrase technology. Any transcriptional errors that result from this process are unintentional.

## 2023-03-01 NOTE — Anesthesia Postprocedure Evaluation (Signed)
Anesthesia Post Note  Patient: Treyshawn M Stradford  Procedure(s) Performed: ESOPHAGOGASTRODUODENOSCOPY (EGD) WITH PROPOFOL  Patient location during evaluation: Endoscopy Anesthesia Type: General Level of consciousness: awake and alert Pain management: pain level controlled Vital Signs Assessment: post-procedure vital signs reviewed and stable Respiratory status: spontaneous breathing, nonlabored ventilation, respiratory function stable and patient connected to nasal cannula oxygen Cardiovascular status: blood pressure returned to baseline and stable Postop Assessment: no apparent nausea or vomiting Anesthetic complications: no   There were no known notable events for this encounter.   Last Vitals:  Vitals:   03/01/23 1252 03/01/23 1302  BP: 109/64 (!) 135/99  Pulse: 67   Resp:    Temp: 36.8 C   SpO2: 98%     Last Pain:  Vitals:   03/01/23 1342  TempSrc:   PainSc: 0-No pain                 Louie Boston

## 2023-03-01 NOTE — Anesthesia Preprocedure Evaluation (Signed)
Anesthesia Evaluation  Patient identified by MRN, date of birth, ID band Patient awake    Reviewed: Allergy & Precautions, NPO status , Patient's Chart, lab work & pertinent test results  History of Anesthesia Complications Negative for: history of anesthetic complications  Airway Mallampati: III  TM Distance: <3 FB Neck ROM: full    Dental  (+) Chipped, Poor Dentition, Missing   Pulmonary shortness of breath and with exertion, COPD, former smoker   Pulmonary exam normal        Cardiovascular Exercise Tolerance: Good hypertension, (-) angina + CAD  + dysrhythmias Atrial Fibrillation      Neuro/Psych  Headaches  negative psych ROS   GI/Hepatic Neg liver ROS,GERD  Controlled,,  Endo/Other  negative endocrine ROS    Renal/GU Renal disease  negative genitourinary   Musculoskeletal   Abdominal   Peds  Hematology negative hematology ROS (+)   Anesthesia Other Findings Patient is NPO appropriate and reports no nausea or vomiting today.  Past Medical History: No date: Atrial fibrillation (HCC) No date: Cancer of kidney (HCC) No date: Emphysema lung (HCC) No date: Hypertension  Past Surgical History: No date: APPENDECTOMY 10/03/2020: ORIF SCAPHOID FRACTURE; Left     Comment:  Procedure: PERCUTANEOUS SCREW FIXATION OF LEFT SCAPHOID               FRACTURE.;  Surgeon: Christena Flake, MD;  Location: [facility]               ORS;  Service: Orthopedics;  Laterality: Left; 10/04/2020: ORIF TOE FRACTURE; Right     Comment:  Procedure: Open Reduction Interal Fixation NAVICULAR               FRACTURE- RT;  Surgeon: Gwyneth Revels, DPM;  Location:               [facility] ORS;  Service: Podiatry;  Laterality: Right; No date: ROTATOR CUFF REPAIR     Comment:  right No date: TONSILLECTOMY  BMI    Body Mass Index: 25.55 kg/m      Reproductive/Obstetrics negative OB ROS                             Anesthesia  Physical Anesthesia Plan  ASA: 3  Anesthesia Plan: General   Post-op Pain Management:    Induction: Intravenous  PONV Risk Score and Plan: Propofol infusion and TIVA  Airway Management Planned: Natural Airway and Nasal Cannula  Additional Equipment:   Intra-op Plan:   Post-operative Plan:   Informed Consent: I have reviewed the patients History and Physical, chart, labs and discussed the procedure including the risks, benefits and alternatives for the proposed anesthesia with the patient or authorized representative who has indicated his/her understanding and acceptance.     Dental Advisory Given  Plan Discussed with: Anesthesiologist, CRNA and Surgeon  Anesthesia Plan Comments: (Patient consented for risks of anesthesia including but not limited to:  - adverse reactions to medications - risk of airway placement if required - damage to eyes, teeth, lips or other oral mucosa - nerve damage due to positioning  - sore throat or hoarseness - Damage to heart, brain, nerves, lungs, other parts of body or loss of life  Patient voiced understanding and assent.)       Anesthesia Quick Evaluation

## 2023-03-01 NOTE — Discharge Instructions (Signed)
Hold aspirin for a few days then can restart

## 2023-03-04 ENCOUNTER — Encounter: Payer: Self-pay | Admitting: Gastroenterology
